# Patient Record
Sex: Female | Born: 1990 | Race: Black or African American | Hispanic: No | Marital: Single | Smoking: Never smoker
Health system: Southern US, Community
[De-identification: ages and names within clinical notes are randomized; demographics above are authoritative.]

## PROBLEM LIST (undated history)

## (undated) DIAGNOSIS — Z789 Other specified health status: Secondary | ICD-10-CM

---

## 2015-07-09 LAB — HM PAP SMEAR: HM Pap smear: NEGATIVE

## 2017-12-12 ENCOUNTER — Encounter (HOSPITAL_COMMUNITY): Payer: Self-pay | Admitting: Emergency Medicine

## 2017-12-12 ENCOUNTER — Ambulatory Visit (INDEPENDENT_AMBULATORY_CARE_PROVIDER_SITE_OTHER): Payer: Self-pay

## 2017-12-12 ENCOUNTER — Ambulatory Visit (HOSPITAL_COMMUNITY)
Admission: EM | Admit: 2017-12-12 | Discharge: 2017-12-12 | Disposition: A | Discharge status: Home / Self Care | Payer: Self-pay | Attending: Internal Medicine | Admitting: Internal Medicine

## 2017-12-12 DIAGNOSIS — B9789 Other viral agents as the cause of diseases classified elsewhere: Secondary | ICD-10-CM

## 2017-12-12 DIAGNOSIS — J069 Acute upper respiratory infection, unspecified: Secondary | ICD-10-CM

## 2017-12-12 DIAGNOSIS — R062 Wheezing: Secondary | ICD-10-CM

## 2017-12-12 MED ORDER — CETIRIZINE HCL 10 MG PO TABS: 10.0000 mg | ORAL_TABLET | Freq: Every day | ORAL | 0 refills | Status: DC

## 2017-12-12 MED ORDER — ALBUTEROL SULFATE HFA 108 (90 BASE) MCG/ACT IN AERS: 1.0000 | INHALATION_SPRAY | Freq: Four times a day (QID) | RESPIRATORY_TRACT | 0 refills | Status: AC | PRN

## 2017-12-12 NOTE — Discharge Instructions (Signed)
Please start daily zyrtec. Albuterol 1-2 puffs every 6 hours as needed for wheezing or shortness of breath. Please call to establish with a primary care provider as may need further evaluation and treatment if symptoms persist. If increased shortness of breath, chest pain, dizziness, lightheadedness or otherwise worsening of symptoms please return or go to the Er.

## 2017-12-12 NOTE — ED Provider Notes (Signed)
[facility]    CSN: [id number] Arrival date & time: 12/12/17  1441     History   Chief Complaint Chief Complaint  Patient presents with  . Shortness of Breath    HPI Sheila Sharp is a 27 y.o. female.   [name] presents with complaints of intermittent episodes of wheezing and shortness of breath. Has worsened over the past few days. Has been intermittent over the past month. Over the past few days she has also developed cold symptoms with cough and congestion as well. She states yesterday and today she has used her brother's asthma inhaler which helped significantly. Denies chest pain or palpitations. Denies nausea, vomiting, sweating. No recent travel or leg pain. She is not on any birth control. Does not smoke. No known contributing medical history.    ROS per HPI.       History reviewed. No pertinent past medical history.  There are no active problems to display for this patient.   History reviewed. No pertinent surgical history.  OB History    No data available       Home Medications    Prior to Admission medications   Medication Sig Start Date End Date Taking? Authorizing Provider  albuterol (PROVENTIL HFA;VENTOLIN HFA) 108 (90 Base) MCG/ACT inhaler Inhale 1-2 puffs into the lungs every 6 (six) hours as needed for wheezing or shortness of breath. 12/12/17   Georgetta Haber, NP  cetirizine (ZYRTEC) 10 MG tablet Take 1 tablet (10 mg total) by mouth daily. 12/12/17   Georgetta Haber, NP    Family History No family history on file.  Social History Social History   Tobacco Use  . Smoking status: Never Smoker  Substance Use Topics  . Alcohol use: Yes  . Drug use: Not on file     Allergies   Patient has no known allergies.   Review of Systems Review of Systems   Physical Exam Triage Vital Signs ED Triage Vitals [12/12/17 1528]  Enc Vitals Group     BP (!) 149/92     Pulse Rate 78     Resp 16     Temp 98.6 F (37 C)   Temp src      SpO2 100 %     Weight      Height      Head Circumference      Peak Flow      Pain Score      Pain Loc      Pain Edu?      Excl. in GC?    No data found.  Updated Vital Signs BP (!) 149/92   Pulse 78   Temp 98.6 F (37 C)   Resp 16   LMP 12/06/2017   SpO2 100%   Visual Acuity Right Eye Distance:   Left Eye Distance:   Bilateral Distance:    Right Eye Near:   Left Eye Near:    Bilateral Near:     Physical Exam  Constitutional: She is oriented to person, place, and time. She appears well-developed and well-nourished. No distress.  HENT:  Head: Normocephalic and atraumatic.  Right Ear: Tympanic membrane, external ear and ear canal normal.  Left Ear: Tympanic membrane, external ear and ear canal normal.  Nose: Nose normal.  Mouth/Throat: Uvula is midline, oropharynx is clear and moist and mucous membranes are normal. No tonsillar exudate.  Eyes: Conjunctivae and EOM are normal. Pupils are equal, round, and reactive to light.  Cardiovascular: Normal  rate, regular rhythm and normal heart sounds.  Pulmonary/Chest: Effort normal and breath sounds normal. She has no wheezes.  Mild upper airway wheezing noted on exam but lungs clear on auscultation; without tachypnea noted; strong dry cough noted   Neurological: She is alert and oriented to person, place, and time.  Skin: Skin is warm and dry.     UC Treatments / Results  Labs (all labs ordered are listed, but only abnormal results are displayed) Labs Reviewed - No data to display  EKG  EKG Interpretation None       Radiology Dg Chest 2 View  Result Date: 12/12/2017 CLINICAL DATA:  27 year old female with shortness of breath coughing and wheezing. EXAM: CHEST - 2 VIEW COMPARISON:  None. FINDINGS: Lung volumes are within normal limits. Mediastinal contours are normal. Visualized tracheal air column is within normal limits. No pneumothorax, pulmonary edema, pleural effusion or confluent pulmonary  opacity. No osseous abnormality identified. Negative visible bowel gas pattern. IMPRESSION: Negative.  No acute cardiopulmonary abnormality. Electronically Signed   By: Odessa FlemingH  Hall M.D.   On: 12/12/2017 16:31    Procedures Procedures (including critical care time)  Medications Ordered in UC Medications - No data to display   Initial Impression / Assessment and Plan / UC Course  I have reviewed the triage vital signs and the nursing notes.  Pertinent labs & imaging results that were available during my care of the patient were reviewed by me and considered in my medical decision making (see chart for details).     Lungs clear at this time although mild upper airway wheezing noted during exam. Chest xray negative for acute findings at this time. Zyrtec initiated at this time. Albuterol inhaler as needed for wheezing and/or shortness of breath. Encouraged establish with a primary care provider for further reevaluation in the next 1-2 weeks. Patient verbalized understanding and agreeable to plan.  Return precautions provided.   Final Clinical Impressions(s) / UC Diagnoses   Final diagnoses:  Viral URI with cough  Wheezing    ED Discharge Orders        Ordered    albuterol (PROVENTIL HFA;VENTOLIN HFA) 108 (90 Base) MCG/ACT inhaler  Every 6 hours PRN     12/12/17 1618    cetirizine (ZYRTEC) 10 MG tablet  Daily     12/12/17 1618       Controlled Substance Prescriptions [place] Controlled Substance Registry consulted? Not Applicable   Georgetta HaberBurky, Yachet Mattson B, NP 12/12/17 [phone]

## 2017-12-12 NOTE — ED Triage Notes (Addendum)
Pt states "im having problems breathing, its been happening for almost a month, it feels like im breathing through a straw." Pt resp e/u, speaking in full sentences. States shes had several episodes of shortness of breath. Pt states "I used my brothers inhaler and it helped a LOT".

## 2017-12-13 LAB — POCT PREGNANCY, URINE: PREG TEST UR: NEGATIVE

## 2018-05-04 LAB — HM HIV SCREENING LAB: HM HIV Screening: NEGATIVE

## 2018-09-27 NOTE — L&D Delivery Note (Signed)
Delivery Note At 11:06 AM a viable female was delivered via Vaginal, Spontaneous (Presentation: Left Occiput Anterior).  APGAR: 8, 9; weight pending.   Placenta status: Spontaneous, Intact.  Cord: 3 vessels with the following complications: None.  Cord pH: N/A  Anesthesia: Epidural Episiotomy: None Lacerations: None Suture Repair: N/A Est. Blood Loss (mL):  336mL  Mom to postpartum.  Baby to Couplet care / Skin to Skin.  Malachy Mood 09/08/2019, 11:27 AM

## 2018-10-13 IMAGING — DX DG CHEST 2V
2 series · 2 of 2 positions shown · non-contrast
Comparison: None.

CLINICAL DATA: 26-year-old female with shortness of breath coughing
and wheezing.

EXAM:
CHEST - 2 VIEW

[chest pa]
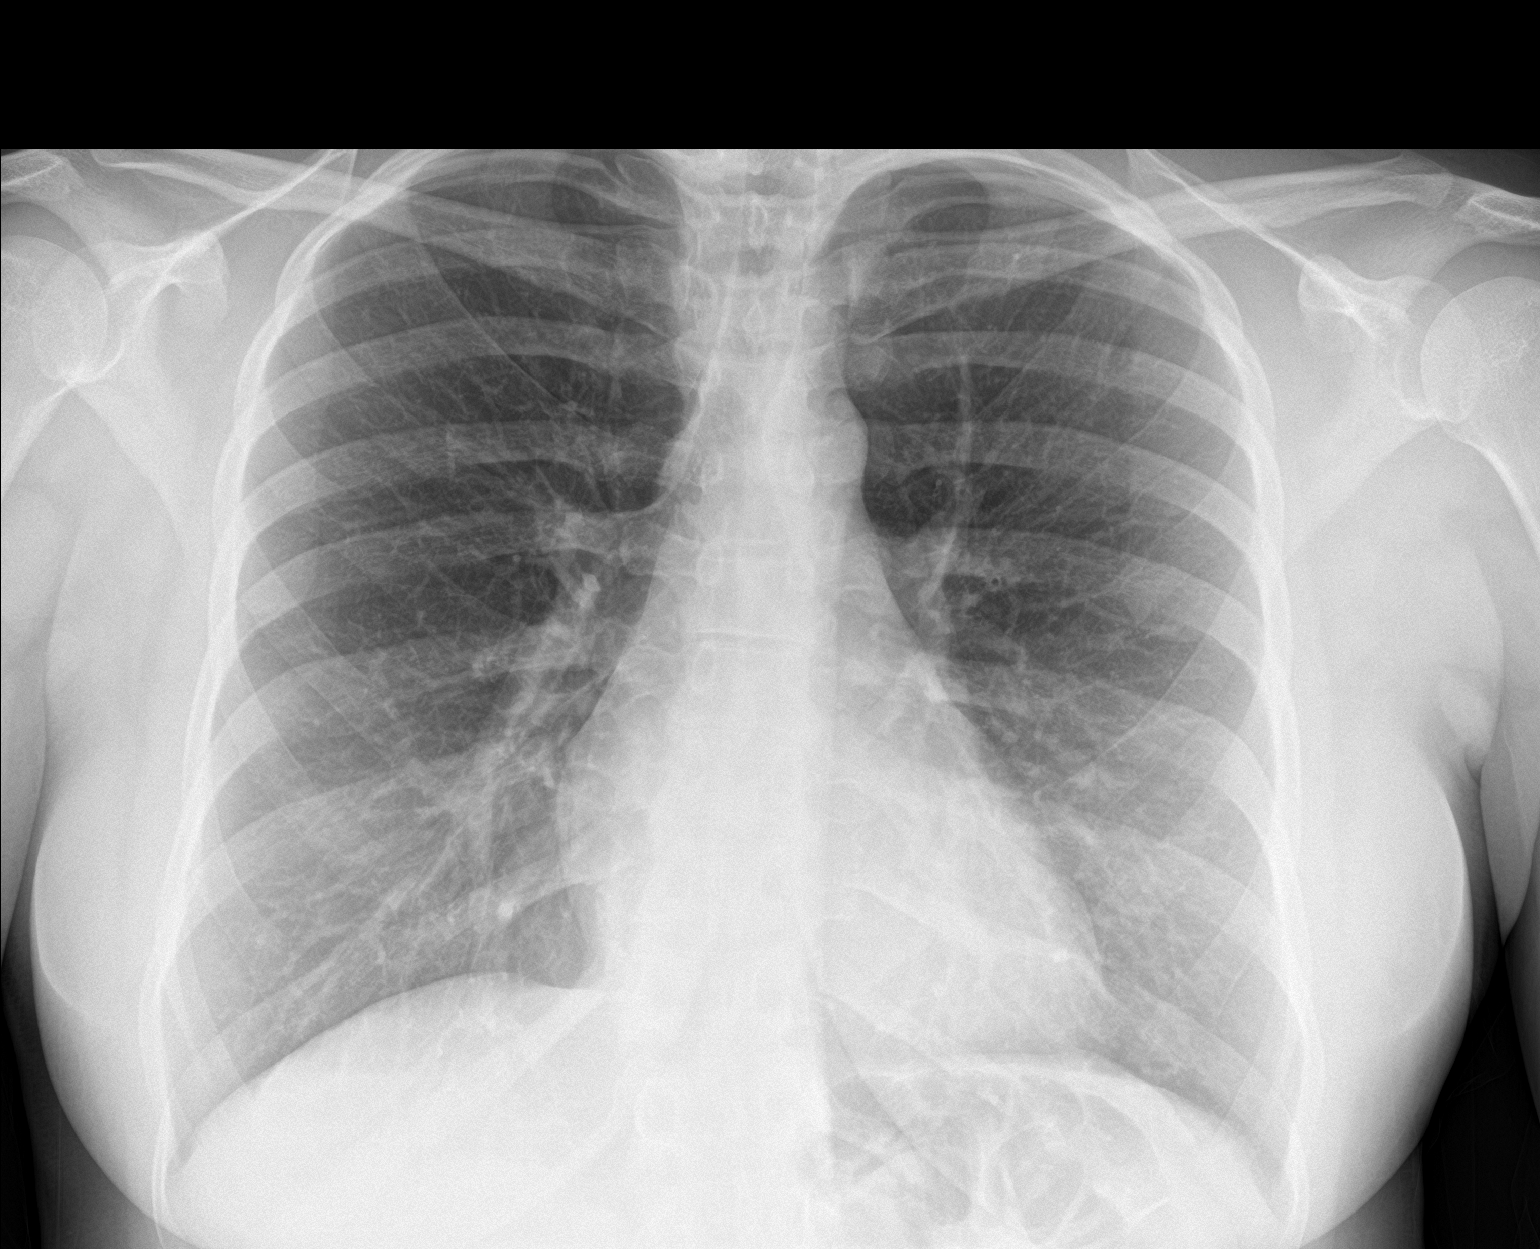

[chest lat]
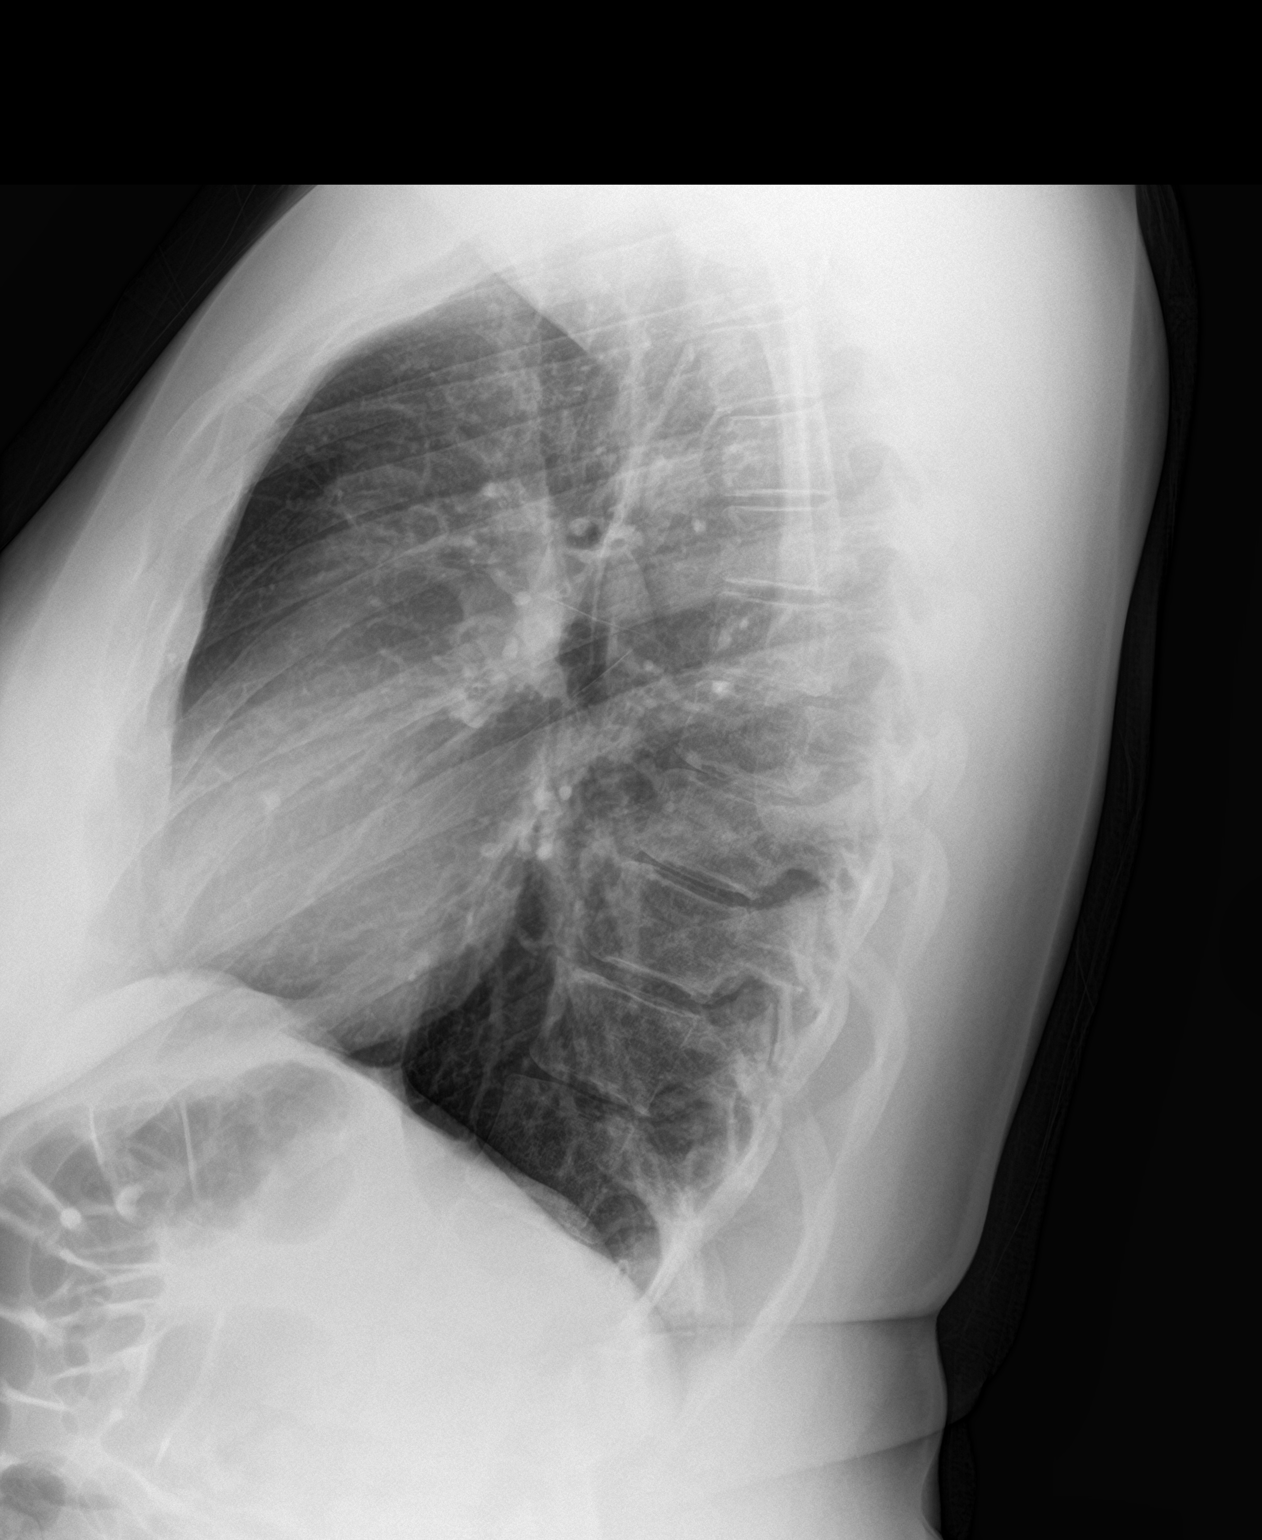

[2 of 2 positions shown; findings below may reference images not displayed]

FINDINGS: Lung volumes are within normal limits. Mediastinal contours are
normal. Visualized tracheal air column is within normal limits. No
pneumothorax, pulmonary edema, pleural effusion or confluent
pulmonary opacity. No osseous abnormality identified. Negative
visible bowel gas pattern.
IMPRESSION: Negative.  No acute cardiopulmonary abnormality.

## 2019-07-09 ENCOUNTER — Ambulatory Visit: Payer: Self-pay

## 2019-07-13 ENCOUNTER — Ambulatory Visit: Payer: Self-pay

## 2019-09-08 ENCOUNTER — Inpatient Hospital Stay
Admission: EM | Admit: 2019-09-08 | Discharge: 2019-09-09 | DRG: 807 | Disposition: A | Discharge status: Home / Self Care | Payer: Medicaid Other | Attending: Obstetrics & Gynecology | Admitting: Obstetrics & Gynecology

## 2019-09-08 ENCOUNTER — Other Ambulatory Visit: Payer: Self-pay

## 2019-09-08 ENCOUNTER — Inpatient Hospital Stay: Payer: Medicaid Other | Admitting: Anesthesiology

## 2019-09-08 DIAGNOSIS — Z3A38 38 weeks gestation of pregnancy: Secondary | ICD-10-CM

## 2019-09-08 DIAGNOSIS — O26893 Other specified pregnancy related conditions, third trimester: Secondary | ICD-10-CM | POA: Diagnosis present

## 2019-09-08 LAB — CBC
HCT: 37 % (ref 36.0–46.0)
Hemoglobin: 12.1 g/dL (ref 12.0–15.0)
MCH: 21 pg — ABNORMAL LOW (ref 26.0–34.0)
MCHC: 32.7 g/dL (ref 30.0–36.0)
MCV: 64.3 fL — ABNORMAL LOW (ref 80.0–100.0)
Platelets: 294 10*3/uL (ref 150–400)
RBC: 5.75 MIL/uL — ABNORMAL HIGH (ref 3.87–5.11)
RDW: 14.9 % (ref 11.5–15.5)
WBC: 14.1 10*3/uL — ABNORMAL HIGH (ref 4.0–10.5)
nRBC: 0 % (ref 0.0–0.2)

## 2019-09-08 LAB — HEPATITIS B SURFACE ANTIGEN: Hepatitis B Surface Ag: NONREACTIVE

## 2019-09-08 LAB — TYPE AND SCREEN
ABO/RH(D): B POS
Antibody Screen: NEGATIVE

## 2019-09-08 LAB — HIV ANTIBODY (ROUTINE TESTING W REFLEX): HIV Screen 4th Generation wRfx: NONREACTIVE

## 2019-09-08 LAB — GROUP B STREP BY PCR: Group B strep by PCR: POSITIVE — AB

## 2019-09-08 LAB — RPR: RPR Ser Ql: NONREACTIVE

## 2019-09-08 MED ORDER — PHENYLEPHRINE 40 MCG/ML (10ML) SYRINGE FOR IV PUSH (FOR BLOOD PRESSURE SUPPORT)
80.0000 ug | PREFILLED_SYRINGE | INTRAVENOUS | Status: DC | PRN
  Filled 2019-09-08: qty 10

## 2019-09-08 MED ORDER — LACTATED RINGERS IV SOLN: 500.0000 mL | Freq: Once | INTRAVENOUS | Status: DC

## 2019-09-08 MED ORDER — ONDANSETRON HCL 4 MG PO TABS: 4.0000 mg | ORAL_TABLET | ORAL | Status: DC | PRN

## 2019-09-08 MED ORDER — DIBUCAINE (PERIANAL) 1 % EX OINT: 1.0000 "application " | TOPICAL_OINTMENT | CUTANEOUS | Status: DC | PRN

## 2019-09-08 MED ORDER — WITCH HAZEL-GLYCERIN EX PADS: 1.0000 "application " | MEDICATED_PAD | CUTANEOUS | Status: DC | PRN

## 2019-09-08 MED ORDER — EPHEDRINE 5 MG/ML INJ
10.0000 mg | INTRAVENOUS | Status: DC | PRN
  Filled 2019-09-08: qty 2

## 2019-09-08 MED ORDER — SODIUM CHLORIDE 0.9 % IV SOLN
1.0000 g | INTRAVENOUS | Status: DC
  Administered 2019-09-08: 1 g via INTRAVENOUS
  Filled 2019-09-08 (×4): qty 1000

## 2019-09-08 MED ORDER — PRENATAL MULTIVITAMIN CH
1.0000 | ORAL_TABLET | Freq: Every day | ORAL | Status: DC
  Administered 2019-09-09: 1 via ORAL
  Filled 2019-09-08: qty 1

## 2019-09-08 MED ORDER — OXYTOCIN 40 UNITS IN NORMAL SALINE INFUSION - SIMPLE MED
2.5000 [IU]/h | INTRAVENOUS | Status: DC
  Administered 2019-09-08: 2.5 [IU]/h via INTRAVENOUS
  Filled 2019-09-08 (×2): qty 1000

## 2019-09-08 MED ORDER — SODIUM CHLORIDE 0.9 % IV SOLN
2.0000 g | Freq: Once | INTRAVENOUS | Status: AC
  Administered 2019-09-08: 2 g via INTRAVENOUS
  Filled 2019-09-08: qty 2000

## 2019-09-08 MED ORDER — IBUPROFEN 600 MG PO TABS
600.0000 mg | ORAL_TABLET | Freq: Four times a day (QID) | ORAL | Status: DC
  Administered 2019-09-08 – 2019-09-09 (×4): 600 mg via ORAL
  Filled 2019-09-08 (×4): qty 1

## 2019-09-08 MED ORDER — MISOPROSTOL 200 MCG PO TABS
ORAL_TABLET | ORAL | Status: AC
  Filled 2019-09-08: qty 4

## 2019-09-08 MED ORDER — ACETAMINOPHEN 325 MG PO TABS: 650.0000 mg | ORAL_TABLET | ORAL | Status: DC | PRN

## 2019-09-08 MED ORDER — BENZOCAINE-MENTHOL 20-0.5 % EX AERO: 1.0000 "application " | INHALATION_SPRAY | CUTANEOUS | Status: DC | PRN

## 2019-09-08 MED ORDER — LIDOCAINE-EPINEPHRINE (PF) 1.5 %-1:200000 IJ SOLN
INTRAMUSCULAR | Status: DC | PRN
  Administered 2019-09-08: 3 mL via PERINEURAL

## 2019-09-08 MED ORDER — LIDOCAINE HCL (PF) 1 % IJ SOLN
30.0000 mL | INTRAMUSCULAR | Status: AC | PRN
  Administered 2019-09-08: 3 mL via SUBCUTANEOUS

## 2019-09-08 MED ORDER — ONDANSETRON HCL 4 MG/2ML IJ SOLN: 4.0000 mg | INTRAMUSCULAR | Status: DC | PRN

## 2019-09-08 MED ORDER — SIMETHICONE 80 MG PO CHEW: 80.0000 mg | CHEWABLE_TABLET | ORAL | Status: DC | PRN

## 2019-09-08 MED ORDER — OXYCODONE-ACETAMINOPHEN 5-325 MG PO TABS: 2.0000 | ORAL_TABLET | ORAL | Status: DC | PRN

## 2019-09-08 MED ORDER — BUPIVACAINE HCL (PF) 0.25 % IJ SOLN: INTRAMUSCULAR | Status: DC | PRN

## 2019-09-08 MED ORDER — LACTATED RINGERS IV SOLN
INTRAVENOUS | Status: DC
  Administered 2019-09-08: 05:00:00 via INTRAVENOUS

## 2019-09-08 MED ORDER — BUTORPHANOL TARTRATE 1 MG/ML IJ SOLN
INTRAMUSCULAR | Status: AC
  Administered 2019-09-08: 1 mg via INTRAVENOUS
  Filled 2019-09-08: qty 1

## 2019-09-08 MED ORDER — SODIUM CHLORIDE 0.9 % IV SOLN: INTRAVENOUS | Status: DC | PRN

## 2019-09-08 MED ORDER — FENTANYL 2.5 MCG/ML W/ROPIVACAINE 0.15% IN NS 100 ML EPIDURAL (ARMC)
12.0000 mL/h | EPIDURAL | Status: DC
  Administered 2019-09-08: 07:00:00 12 mL/h via EPIDURAL

## 2019-09-08 MED ORDER — ONDANSETRON HCL 4 MG/2ML IJ SOLN
4.0000 mg | Freq: Four times a day (QID) | INTRAMUSCULAR | Status: DC | PRN
  Administered 2019-09-08: 4 mg via INTRAVENOUS
  Filled 2019-09-08: qty 2

## 2019-09-08 MED ORDER — SENNOSIDES-DOCUSATE SODIUM 8.6-50 MG PO TABS
2.0000 | ORAL_TABLET | ORAL | Status: DC
  Administered 2019-09-09: 2 via ORAL

## 2019-09-08 MED ORDER — OXYTOCIN BOLUS FROM INFUSION
500.0000 mL | Freq: Once | INTRAVENOUS | Status: AC
  Administered 2019-09-08: 500 mL via INTRAVENOUS

## 2019-09-08 MED ORDER — OXYTOCIN 10 UNIT/ML IJ SOLN
INTRAMUSCULAR | Status: AC
  Filled 2019-09-08: qty 2

## 2019-09-08 MED ORDER — BUTORPHANOL TARTRATE 1 MG/ML IJ SOLN: 1.0000 mg | INTRAMUSCULAR | Status: DC | PRN

## 2019-09-08 MED ORDER — LACTATED RINGERS IV SOLN: 500.0000 mL | INTRAVENOUS | Status: DC | PRN

## 2019-09-08 MED ORDER — FENTANYL 2.5 MCG/ML W/ROPIVACAINE 0.15% IN NS 100 ML EPIDURAL (ARMC)
EPIDURAL | Status: AC
  Filled 2019-09-08: qty 100

## 2019-09-08 MED ORDER — COCONUT OIL OIL: 1.0000 "application " | TOPICAL_OIL | Status: DC | PRN

## 2019-09-08 MED ORDER — ACETAMINOPHEN 325 MG PO TABS
650.0000 mg | ORAL_TABLET | ORAL | Status: DC | PRN
  Administered 2019-09-08: 650 mg via ORAL
  Filled 2019-09-08: qty 2

## 2019-09-08 MED ORDER — DIPHENHYDRAMINE HCL 50 MG/ML IJ SOLN: 12.5000 mg | INTRAMUSCULAR | Status: DC | PRN

## 2019-09-08 MED ORDER — OXYCODONE-ACETAMINOPHEN 5-325 MG PO TABS: 1.0000 | ORAL_TABLET | ORAL | Status: DC | PRN

## 2019-09-08 MED ORDER — LIDOCAINE HCL (PF) 1 % IJ SOLN
INTRAMUSCULAR | Status: AC
  Filled 2019-09-08: qty 30

## 2019-09-08 MED ORDER — DIPHENHYDRAMINE HCL 25 MG PO CAPS: 25.0000 mg | ORAL_CAPSULE | Freq: Four times a day (QID) | ORAL | Status: DC | PRN

## 2019-09-08 MED ORDER — AMMONIA AROMATIC IN INHA
RESPIRATORY_TRACT | Status: AC
  Filled 2019-09-08: qty 10

## 2019-09-08 MED ORDER — BUPIVACAINE HCL (PF) 0.25 % IJ SOLN
INTRAMUSCULAR | Status: DC | PRN
  Administered 2019-09-08: 10 mg via EPIDURAL

## 2019-09-08 NOTE — Progress Notes (Signed)
  Labor Progress Note   28 y.o. G1P0 @ [redacted]w[redacted]d , admitted for  Pregnancy, Labor Management.   Subjective:  Pain better w epidural  Objective:  BP 138/79   Pulse 79   Temp 98.4 F (36.9 C)   Resp 20   Ht 6' (1.829 m)   Wt 115.2 kg   SpO2 100%   BMI 34.45 kg/m  Abd: gravid, ND, FHT present, mild tenderness on exam Extr: trace to 1+ bilateral pedal edema SVE: CERVIX: 6 cm dilated  EFM: FHR: 130 bpm, variability: moderate,  accelerations:  Present,  decelerations:  Absent Toco: Frequency: Every 3-5 minutes Labs: I have reviewed the patient's lab results.   Assessment & Plan:  G1P0 @ [redacted]w[redacted]d, admitted for  Pregnancy and Labor/Delivery Management  1. Pain management: epidural. 2. FWB: FHT category 1.  3. ID: GBS not done 4. Labor management: Cont exp mgt  All discussed with patient, see orders  Barnett Applebaum, MD, [facility], [facility] 09/08/2019  7:50 AM

## 2019-09-08 NOTE — Anesthesia Procedure Notes (Signed)
Epidural Patient location during procedure: OB Start time: 09/08/2019 6:05 AM End time: 09/08/2019 6:19 AM  Staffing Anesthesiologist: Alvin Critchley, MD Performed: anesthesiologist   Preanesthetic Checklist Completed: patient identified, IV checked, site marked, risks and benefits discussed, surgical consent, monitors and equipment checked, pre-op evaluation and timeout performed  Epidural Patient position: sitting Prep: Betadine Patient monitoring: heart rate, continuous pulse ox and blood pressure Approach: midline Location: L3-L4 Injection technique: LOR air  Needle:  Needle type: Tuohy  Needle gauge: 17 G Needle length: 9 cm and 9 Catheter type: closed end flexible Catheter size: 19 Gauge Test dose: negative and 1.5% lidocaine with Epi 1:200 K  Assessment Sensory level: T8 Events: blood not aspirated, injection not painful, no injection resistance, no paresthesia and negative IV test  Additional Notes Time out called.  Patient placed in sitting position.  Back prepped and draped in sterile fashion.  A skin wheal was made in the L3-L4 interspace with 1% Lidocaine plain.  A 17G Tuohy needle was advanced into the epidural space by a loss of resistance technique. The epidural catheter was advanced 3 cm and the TD was negative.  No blood, fluid or paresthesias.  The patient tolerated the procedure well and the cathter was affixed to the back in sterile fashion.Reason for block:procedure for pain

## 2019-09-08 NOTE — H&P (Signed)
Obstetrics Admission History & Physical   Contractions   HPI:  28 y.o. G1P0 @ [redacted]w[redacted]d (09/20/2019, by Patient Reported). Admitted on 09/08/2019:     Presents for painful contractions that began last night and have been worsening, currently every 2 minutes and rated 10/10. Denies VB or ROM.  Was seen at different hospital and was ruled out for labor and thus sent home.  She came to [facility] as this is where her mother lives.  No other problems reported this pregnancy; denies HTN, DM, PTL, other.  Prenatal care at: at another place. Pregnancy complicated by none.  ROS: A review of systems was performed and negative, except as stated in the above HPI. PMHx: History reviewed. No pertinent past medical history. PSHx: History reviewed. No pertinent surgical history. Medications:  Medications Prior to Admission  Medication Sig Dispense Refill Last Dose  . albuterol (PROVENTIL HFA;VENTOLIN HFA) 108 (90 Base) MCG/ACT inhaler Inhale 1-2 puffs into the lungs every 6 (six) hours as needed for wheezing or shortness of breath. 1 Inhaler 0 09/07/2019  . cetirizine (ZYRTEC) 10 MG tablet Take 1 tablet (10 mg total) by mouth daily. 30 tablet 0 09/07/2019  . Prenatal Vit-Fe Fumarate-FA (MULTIVITAMIN-PRENATAL) 27-0.8 MG TABS tablet Take 1 tablet by mouth daily at 12 noon.   09/07/2019   Allergies: has No Known Allergies. OBHx:  OB History  Gravida Para Term Preterm AB Living  1            SAB TAB Ectopic Multiple Live Births               # Outcome Date GA Lbr Len/2nd Weight Sex Delivery Anes PTL Lv  1 Current            TOI:ZTIWPYKD/[id number] except as detailed in HPI.Marland Kitchen  No family history of birth defects. Soc Hx: Alcohol: none and Recreational drug use: none  Objective:   Vitals:   09/08/19 0217  BP: 138/79  Pulse: 79  Resp: 20  Temp: 98.4 F (36.9 C)  SpO2: 100%   Constitutional: Well nourished, well developed female in no acute distress.  HEENT: normal Skin: Warm and dry.   Cardiovascular:Regular rate and rhythm.   Extremity: trace to 1+ bilateral pedal edema Respiratory: Clear to auscultation bilateral. Normal respiratory effort Abdomen: gravid, ND, FHT present, marked tenderness on exam Back: no CVAT Neuro: DTRs 2+, Cranial nerves grossly intact Psych: Alert and Oriented x3. No memory deficits. Normal mood and affect.  MS: normal gait, normal bilateral lower extremity ROM/strength/stability.  Pelvic exam: is not limited by body habitus EGBUS: within normal limits Vagina: within normal limits and with normal mucosa Cervix: 1/80/-3 Uterus: Spontaneous uterine activity  Adnexa: not evaluated  EFM:FHR: 120 bpm, variability: minimal ,  accelerations:  Present,  decelerations:  Absent Toco: Frequency: Every 2-3 minutes   Perinatal info:  Unknown, will obtain records as available  Assessment & Plan:   28 y.o. G1P0 @ [redacted]w[redacted]d, Admitted on 09/08/2019:in early labor with a lot of pain No bleeding or signs of abruption No reported or suspected drug use    Admit for labor, Observe for cervical change and Fetal Wellbeing Reassuring  Barnett Applebaum, MD, [facility], [facility] 09/08/2019  4:25 AM

## 2019-09-08 NOTE — OB Triage Note (Signed)
Pt is a 28y/o G1P0 at [redacted]w[redacted]d with c/o contractions. Pt states +FM. Pt denies LOF, and VB. Pt states she was seen earlier today from 8-10PM for vaginal bleeding that stopped around 6PM and was discharged. Pt states she has not had any more vaginal bleeding since. Pt rates ctx 10/10 on pain sca Monitors applied and assessing. Initial FHT 125.

## 2019-09-08 NOTE — Discharge Summary (Signed)
Obstetric Discharge Summary Reason for Admission: onset of labor Prenatal Procedures: none Intrapartum Procedures: spontaneous vaginal delivery Postpartum Procedures: none Complications-Operative and Postpartum: none Hemoglobin  Date Value Ref Range Status  09/09/2019 10.8 (L) 12.0 - 15.0 g/dL Final   HCT  Date Value Ref Range Status  09/09/2019 33.6 (L) 36.0 - 46.0 % Final    Physical Exam:  General: alert, appears stated age and no distress Lochia: appropriate Uterine Fundus: firm Incision: n/a DVT Evaluation: No evidence of DVT seen on physical exam.  Discharge Diagnoses: Term Pregnancy-delivered  Discharge Information: Date: 09/09/2019 Activity: pelvic rest Diet: routine Allergies as of 09/09/2019   No Known Allergies     Medication List    TAKE these medications   albuterol 108 (90 Base) MCG/ACT inhaler Commonly known as: VENTOLIN HFA Inhale 1-2 puffs into the lungs every 6 (six) hours as needed for wheezing or shortness of breath.   cetirizine 10 MG tablet Commonly known as: ZYRTEC Take 1 tablet (10 mg total) by mouth daily.   ibuprofen 600 MG tablet Commonly known as: ADVIL Take 1 tablet (600 mg total) by mouth every 6 (six) hours.   multivitamin-prenatal 27-0.8 MG Tabs tablet Take 1 tablet by mouth daily at 12 noon.   norgestimate-ethinyl estradiol 0.25-35 MG-MCG tablet Commonly known as: ORTHO-CYCLEN Take 1 tablet by mouth daily. Start 3 week after you delivery       Condition: stable Discharge to: home Follow-up Information    Malachy Mood, MD Follow up in 6 week(s).   Specialty: Obstetrics and Gynecology Why: 6 week postpartum, alternatively you may follow up with your established OB provider Contact information: [place] [address] [phone]           Newborn Data: Live born child  Birth Weight:   APGAR: 67, 9  Newborn Delivery   Birth date/time: 09/08/2019 11:06:00 Delivery type: Vaginal,  Spontaneous      Home with mother.  Malachy Mood 09/09/2019, 9:52 AM

## 2019-09-08 NOTE — Anesthesia Preprocedure Evaluation (Addendum)
Anesthesia Evaluation  Patient identified by MRN, date of birth, ID band Patient awake    Reviewed: Allergy & Precautions, NPO status , Patient's Chart, lab work & pertinent test results  Airway Mallampati: II       Dental   Pulmonary neg pulmonary ROS,    Pulmonary exam normal        Cardiovascular negative cardio ROS Normal cardiovascular exam     Neuro/Psych negative neurological ROS  negative psych ROS   GI/Hepatic negative GI ROS, Neg liver ROS,   Endo/Other  negative endocrine ROS  Renal/GU negative Renal ROS  negative genitourinary   Musculoskeletal negative musculoskeletal ROS (+)   Abdominal Normal abdominal exam  (+)   Peds negative pediatric ROS (+)  Hematology negative hematology ROS (+)   Anesthesia Other Findings   Reproductive/Obstetrics (+) Pregnancy                             Anesthesia Physical Anesthesia Plan  ASA: II  Anesthesia Plan: Epidural   Post-op Pain Management:    Induction: Intravenous  PONV Risk Score and Plan:   Airway Management Planned: Nasal Cannula  Additional Equipment:   Intra-op Plan:   Post-operative Plan:   Informed Consent: I have reviewed the patients History and Physical, chart, labs and discussed the procedure including the risks, benefits and alternatives for the proposed anesthesia with the patient or authorized representative who has indicated his/her understanding and acceptance.     Dental advisory given  Plan Discussed with: CRNA and Surgeon  Anesthesia Plan Comments:         Anesthesia Quick Evaluation

## 2019-09-09 LAB — CBC
HCT: 33.6 % — ABNORMAL LOW (ref 36.0–46.0)
Hemoglobin: 10.8 g/dL — ABNORMAL LOW (ref 12.0–15.0)
MCH: 21.3 pg — ABNORMAL LOW (ref 26.0–34.0)
MCHC: 32.1 g/dL (ref 30.0–36.0)
MCV: 66.3 fL — ABNORMAL LOW (ref 80.0–100.0)
Platelets: 279 10*3/uL (ref 150–400)
RBC: 5.07 MIL/uL (ref 3.87–5.11)
RDW: 14.9 % (ref 11.5–15.5)
WBC: 12.6 10*3/uL — ABNORMAL HIGH (ref 4.0–10.5)
nRBC: 0 % (ref 0.0–0.2)

## 2019-09-09 LAB — RUBELLA SCREEN: Rubella: 3.07 index (ref >0.99)

## 2019-09-09 MED ORDER — IBUPROFEN 600 MG PO TABS: 600.0000 mg | ORAL_TABLET | Freq: Four times a day (QID) | ORAL | 0 refills | Status: DC

## 2019-09-09 MED ORDER — NORGESTIMATE-ETH ESTRADIOL 0.25-35 MG-MCG PO TABS: 1.0000 | ORAL_TABLET | Freq: Every day | ORAL | 11 refills | Status: DC

## 2019-09-09 NOTE — Discharge Instructions (Signed)
Discharge Instructions:   Follow-up Appointment: Call ASAP and schedule a follow-up postpartum visit in 6 weeks!   If there are any new medications, they have been ordered and will be available for pickup at the listed pharmacy on your way home from the hospital.   Call office if you have any of the following: headache, visual changes, fever >101.0 F, chills, shortness of breath, breast concerns, excessive vaginal bleeding, incision drainage or problems, leg pain or redness, depression or any other concerns. If you have vaginal discharge with an odor, let your doctor know.   It is normal to bleed for up to 6 weeks. You should not soak through more than 1 pad in 1 hour. If you have a blood clot larger than your fist with continued bleeding, call your doctor.   Activity: Do not lift > 10 lbs for 6 weeks (do not lift anything heavier than your baby). No intercourse, tampons, swimming pools, hot tubs, baths (only showers) for 6 weeks.  No driving for 1-2 weeks. Continue prenatal vitamin, especially if breastfeeding. Increase calories and fluids (water) while breastfeeding.   Your milk will come in, in the next couple of days (right now it is colostrum). You may have a slight fever when your milk comes in, but it should go away on its own.  If it does not, and rises above 101 F please call the doctor. You will also feel achy and your breasts will be firm. They will also start to leak. If you are breastfeeding, continue as you have been and you can pump/express milk for comfort.   If you have too much milk, your breasts can become engorged, which could lead to mastitis. This is an infection of the milk ducts. It can be very painful and you will need to notify your doctor to obtain a prescription for antibiotics. You can also treat it with a shower or hot/cold compress.   For concerns about your baby, please call your pediatrician.  For breastfeeding concerns, the lactation consultant can be reached at  [phone].   Postpartum blues (feelings of happy one minute and sad another minute) are normal for the first few weeks but if it gets worse let your doctor know.   Congratulations! We enjoyed caring for you and your new bundle of joy!  

## 2019-09-09 NOTE — Anesthesia Postprocedure Evaluation (Signed)
Anesthesia Post Note  Patient: Sheila Sharp  Procedure(s) Performed: AN AD [place]  Patient location during evaluation: Mother Baby Anesthesia Type: Epidural Level of consciousness: awake and alert Pain management: pain level controlled Vital Signs Assessment: post-procedure vital signs reviewed and stable Respiratory status: spontaneous breathing, nonlabored ventilation and respiratory function stable Cardiovascular status: stable Postop Assessment: no headache, no backache and epidural receding (Mild soreness at insertion site, clean) Anesthetic complications: no     Last Vitals:  Vitals:   09/08/19 2319 09/09/19 0314  BP: 128/80 120/71  Pulse: 72 65  Resp:  20  Temp: 37 C 36.5 C  SpO2: 100% 100%    Last Pain:  Vitals:   09/09/19 0314  TempSrc: Oral  PainSc: 0-No pain                 Alphonsus Sias

## 2019-09-09 NOTE — Progress Notes (Signed)
Pt discharged with infant. Discharge instructions, prescriptions, and follow up appointments given to and reviewed with patient. Pt verbalized understanding. Escorted out by auxillary.  

## 2021-03-31 DIAGNOSIS — G971 Other reaction to spinal and lumbar puncture: Secondary | ICD-10-CM

## 2021-10-26 ENCOUNTER — Ambulatory Visit: Payer: Medicaid Other

## 2022-03-12 ENCOUNTER — Other Ambulatory Visit (HOSPITAL_COMMUNITY): Payer: Self-pay

## 2022-06-17 ENCOUNTER — Ambulatory Visit (LOCAL_COMMUNITY_HEALTH_CENTER): Payer: Medicaid Other | Admitting: Advanced Practice Midwife

## 2022-06-17 ENCOUNTER — Encounter: Payer: Self-pay | Admitting: Advanced Practice Midwife

## 2022-06-17 VITALS — BP 114/75 | Ht 72.0 in | Wt 258.6 lb

## 2022-06-17 DIAGNOSIS — Z01419 Encounter for gynecological examination (general) (routine) without abnormal findings: Secondary | ICD-10-CM | POA: Diagnosis not present

## 2022-06-17 DIAGNOSIS — Z3009 Encounter for other general counseling and advice on contraception: Secondary | ICD-10-CM

## 2022-06-17 DIAGNOSIS — N760 Acute vaginitis: Secondary | ICD-10-CM

## 2022-06-17 DIAGNOSIS — E669 Obesity, unspecified: Secondary | ICD-10-CM

## 2022-06-17 DIAGNOSIS — Z309 Encounter for contraceptive management, unspecified: Secondary | ICD-10-CM

## 2022-06-17 DIAGNOSIS — B9689 Other specified bacterial agents as the cause of diseases classified elsewhere: Secondary | ICD-10-CM

## 2022-06-17 LAB — WET PREP FOR TRICH, YEAST, CLUE
Trichomonas Exam: NEGATIVE
Yeast Exam: NEGATIVE

## 2022-06-17 MED ORDER — METRONIDAZOLE 500 MG PO TABS: 500.0000 mg | ORAL_TABLET | Freq: Two times a day (BID) | ORAL | 0 refills | Status: AC

## 2022-06-17 NOTE — Progress Notes (Signed)
[facility] Number: [phone]    Family Planning Visit- Initial Visit  Subjective:  Sheila Sharp is a 31 y.o. SBF nonsmoker  G2P1002 (2,1)  being seen today for an initial annual visit and to discuss reproductive life planning.  The patient is currently using No Method - Other Reason for pregnancy prevention. Patient reports   does not know want a pregnancy in the next year.     report they are looking for a method that provides Other no birth control  Patient has the following medical conditions has Obesity BMI=35 on their problem list.  Chief Complaint  Patient presents with   Contraception    Physical, pap, std screening     Patient reports here for physical, pap, STD screen. LMP 05/26/22. Last sex 06/07/22 without condom; with current partner x 2 mo; 2 partners in last 3 mo. May want a pregnancy. Last ETOH yesterday (5 shots liquor) q weekend. Last dental exam 2022. Not working. Living with her 2 kids and her mom. Last PE 11/18/16. Last pap 07/09/15 neg.  Patient denies cigs, vaping, cigars, MJ  Body mass index is 35.07 kg/m. - Patient is eligible for diabetes screening based on BMI and age >33?  not applicable AS5K ordered? not applicable  Patient reports 2  partner/s in last year. Desires STI screening?  No - declines bloodwork  Has patient been screened once for HCV in the past?  No  No results found for: "HCVAB"  Does the patient have current drug use (including MJ), have a partner with drug use, and/or has been incarcerated since last result? No  If yes-- Screen for HCV through [facility]   Does the patient meet criteria for HBV testing? No  Criteria:  -Household, sexual or needle sharing contact with HBV -History of drug use -HIV positive -Those with known Hep C   Health Maintenance Due  Topic Date Due   COVID-19 Vaccine (1) Never done   Hepatitis C Screening  Never done    TETANUS/TDAP  05/13/2018   PAP SMEAR-Modifier  07/08/2018   INFLUENZA VACCINE  Never done    Review of Systems  Constitutional:  Positive for weight loss (30 lb wt gain in past 3 mo; goes to gym 4x/wk x 1 hour).  All other systems reviewed and are negative.   The following portions of the patient's history were reviewed and updated as appropriate: allergies, current medications, past family history, past medical history, past social history, past surgical history and problem list. Problem list updated.   See flowsheet for other program required questions.  Objective:   Vitals:   06/17/22 1313  BP: 114/75  Weight: 258 lb 9.6 oz (117.3 kg)  Height: 6' (1.829 m)    Physical Exam Constitutional:      Appearance: Normal appearance. She is obese.  HENT:     Head: Normocephalic and atraumatic.     Mouth/Throat:     Mouth: Mucous membranes are moist.  Eyes:     Conjunctiva/sclera: Conjunctivae normal.  Neck:     Thyroid: No thyroid mass, thyromegaly or thyroid tenderness.  Cardiovascular:     Rate and Rhythm: Normal rate and regular rhythm.  Pulmonary:     Effort: Pulmonary effort is normal.     Breath sounds: Normal breath sounds.  Chest:  Breasts:    Right: Normal.     Left: Normal.  Abdominal:     Palpations: Abdomen  is soft.     Comments: Soft without masses or tenderness, fair toene  Genitourinary:    General: Normal vulva.     Exam position: Lithotomy position.     Vagina: Vaginal discharge (grey thin leukorrhea with malodor, >4.5) present.     Cervix: Normal.     Uterus: Normal.      Adnexa: Right adnexa normal and left adnexa normal.     Rectum: Normal.     Comments: Pap done Musculoskeletal:        General: Normal range of motion.     Cervical back: Normal range of motion and neck supple.  Skin:    General: Skin is warm and dry.  Neurological:     Mental Status: She is alert.  Psychiatric:        Mood and Affect: Mood normal.       Assessment and  Plan:  [name] is a 31 y.o. female presenting to the [facility] Department for an initial annual wellness/contraceptive visit  Contraception counseling: Reviewed options based on patient desire and reproductive life plan. Patient is interested in No Method - Other Reason. This was provided to the patient today.  if not why not clearly documented  Risks, benefits, and typical effectiveness rates were reviewed.  Questions were answered.  Written information was also given to the patient to review.    The patient will follow up in  prn  for surveillance.  The patient was told to call with any further questions, or with any concerns about this method of contraception.  Emphasized use of condoms 100% of the time for STI prevention.  Need for ECP was assessed. Patient reported > 120 hours .  Reviewed options and patient desired No method of ECP, declined all    1. Obesity, unspecified classification, unspecified obesity type, unspecified whether serious comorbidity present   2. Family planning Please give pt dental list Treat wet mount per standing orders Immunization nurse consult  - WET PREP FOR TRICH, YEAST, CLUE - Chlamydia/Gonorrhea [facility]  3. Well woman exam with routine gynecological exam Declines birth control     No follow-ups on file.  No future appointments.  Alberteen Spindle, CNM

## 2022-06-17 NOTE — Progress Notes (Signed)
Pt is here for PE, Pap smear and STD screening.  Wet mount results reviewed.  The patient was dispensed Metronidazole 500 mg #14  today. I provided counseling today regarding the medication. We discussed the medication, the side effects and when to call clinic. Patient given the opportunity to ask questions. Questions answered.  FP packet given to pt.

## 2022-06-23 LAB — IGP, APTIMA HPV
HPV Aptima: NEGATIVE
PAP Smear Comment: 0

## 2022-06-29 ENCOUNTER — Telehealth: Payer: Self-pay

## 2022-06-29 ENCOUNTER — Other Ambulatory Visit: Payer: Self-pay

## 2022-06-29 ENCOUNTER — Emergency Department
Admission: EM | Admit: 2022-06-29 | Discharge: 2022-06-29 | Disposition: A | Discharge status: Home / Self Care | Payer: Medicaid Other | Attending: Emergency Medicine | Admitting: Emergency Medicine

## 2022-06-29 DIAGNOSIS — N760 Acute vaginitis: Secondary | ICD-10-CM | POA: Diagnosis not present

## 2022-06-29 DIAGNOSIS — R1030 Lower abdominal pain, unspecified: Secondary | ICD-10-CM | POA: Diagnosis present

## 2022-06-29 DIAGNOSIS — B9689 Other specified bacterial agents as the cause of diseases classified elsewhere: Secondary | ICD-10-CM

## 2022-06-29 DIAGNOSIS — R109 Unspecified abdominal pain: Secondary | ICD-10-CM

## 2022-06-29 LAB — URINALYSIS, ROUTINE W REFLEX MICROSCOPIC
Bilirubin Urine: NEGATIVE
Glucose, UA: NEGATIVE mg/dL
Ketones, ur: 20 mg/dL — AB
Nitrite: NEGATIVE
Protein, ur: 100 mg/dL — AB
Specific Gravity, Urine: 1.026 (ref 1.005–1.030)
pH: 5 (ref 5.0–8.0)

## 2022-06-29 LAB — COMPREHENSIVE METABOLIC PANEL
ALT: 12 U/L (ref 0–44)
AST: 14 U/L — ABNORMAL LOW (ref 15–41)
Albumin: 3.9 g/dL (ref 3.5–5.0)
Alkaline Phosphatase: 51 U/L (ref 38–126)
Anion gap: 11 (ref 5–15)
BUN: 9 mg/dL (ref 6–20)
CO2: 24 mmol/L (ref 22–32)
Calcium: 8.9 mg/dL (ref 8.9–10.3)
Chloride: 100 mmol/L (ref 98–111)
Creatinine, Ser: 0.84 mg/dL (ref 0.44–1.00)
GFR, Estimated: >60 mL/min (ref >60)
Glucose, Bld: 114 mg/dL — ABNORMAL HIGH (ref 70–99)
Potassium: 3.2 mmol/L — ABNORMAL LOW (ref 3.5–5.1)
Sodium: 135 mmol/L (ref 135–145)
Total Bilirubin: 1.3 mg/dL — ABNORMAL HIGH (ref 0.3–1.2)
Total Protein: 8 g/dL (ref 6.5–8.1)

## 2022-06-29 LAB — WET PREP, GENITAL
Sperm: NONE SEEN
Trich, Wet Prep: NONE SEEN
WBC, Wet Prep HPF POC: >=10 — AB (ref <10)
Yeast Wet Prep HPF POC: NONE SEEN

## 2022-06-29 LAB — CBC
HCT: 39.9 % (ref 36.0–46.0)
Hemoglobin: 12.1 g/dL (ref 12.0–15.0)
MCH: 20.7 pg — ABNORMAL LOW (ref 26.0–34.0)
MCHC: 30.3 g/dL (ref 30.0–36.0)
MCV: 68.3 fL — ABNORMAL LOW (ref 80.0–100.0)
Platelets: 300 10*3/uL (ref 150–400)
RBC: 5.84 MIL/uL — ABNORMAL HIGH (ref 3.87–5.11)
RDW: 15.9 % — ABNORMAL HIGH (ref 11.5–15.5)
WBC: 12.1 10*3/uL — ABNORMAL HIGH (ref 4.0–10.5)
nRBC: 0 % (ref 0.0–0.2)

## 2022-06-29 LAB — LIPASE, BLOOD: Lipase: 34 U/L (ref 11–51)

## 2022-06-29 LAB — POC URINE PREG, ED: Preg Test, Ur: NEGATIVE

## 2022-06-29 LAB — CHLAMYDIA/NGC RT PCR (ARMC ONLY)
Chlamydia Tr: NOT DETECTED
N gonorrhoeae: DETECTED — AB

## 2022-06-29 MED ORDER — LIDOCAINE HCL (PF) 1 % IJ SOLN
1.0000 mL | Freq: Once | INTRAMUSCULAR | Status: AC
  Administered 2022-06-29: 1 mL

## 2022-06-29 MED ORDER — CEFTRIAXONE SODIUM 250 MG IJ SOLR
250.0000 mg | Freq: Once | INTRAMUSCULAR | Status: AC
  Administered 2022-06-29: 250 mg via INTRAMUSCULAR
  Filled 2022-06-29: qty 250

## 2022-06-29 MED ORDER — METRONIDAZOLE 500 MG PO TABS: 500.0000 mg | ORAL_TABLET | Freq: Two times a day (BID) | ORAL | 0 refills | Status: AC

## 2022-06-29 MED ORDER — AZITHROMYCIN 500 MG PO TABS
1000.0000 mg | ORAL_TABLET | Freq: Once | ORAL | Status: AC
  Administered 2022-06-29: 1000 mg via ORAL
  Filled 2022-06-29: qty 2

## 2022-06-29 NOTE — ED Triage Notes (Signed)
Pt comes with c/o 3 days belly pain the patient states lower belly pain. Pt states no N/V.D. pt states little pain when peeing and attempting to have BM.

## 2022-06-29 NOTE — ED Provider Notes (Signed)
[facility] Provider Note    Event Date/Time   First MD Initiated Contact with Patient 06/29/22 1139     (approximate)  History   Chief Complaint: Abdominal Pain  HPI  Sheila Sharp is a 31 y.o. female with no significant past medical history presents emergency department for lower abdominal discomfort.  According to the patient over the past 3 to 4 days she has been experiencing some lower abdominal discomfort.  Denies any nausea vomiting or diarrhea.  No dysuria.  States she feels somewhat constipated at times.  Denies any fever.  Patient states she was seen at the health department approximately 10 days ago had a pelvic exam performed but is not yet heard of any results.  Denies any vaginal discharge did recently start her menstrual cycle.  No fever.  Physical Exam   Triage Vital Signs: ED Triage Vitals  Enc Vitals Group     BP 06/29/22 1126 131/78     Pulse Rate 06/29/22 1126 (!) 111     Resp 06/29/22 1126 18     Temp 06/29/22 1126 98 F (36.7 C)     Temp src --      SpO2 06/29/22 1126 97 %     Weight 06/29/22 1232 258 lb 9.6 oz (117.3 kg)     Height 06/29/22 1232 6' (1.829 m)     Head Circumference --      Peak Flow --      Pain Score 06/29/22 1124 8     Pain Loc --      Pain Edu? --      Excl. in [place]? --     Most recent vital signs: Vitals:   06/29/22 1126  BP: 131/78  Pulse: (!) 111  Resp: 18  Temp: 98 F (36.7 C)  SpO2: 97%    General: Awake, no distress.  CV:  Good peripheral perfusion.  Regular rate and rhythm  Resp:  Normal effort.  Equal breath sounds bilaterally.  Abd:  No distention.  Soft, nontender.  No rebound or guarding.   ED Results / Procedures / Treatments   MEDICATIONS ORDERED IN ED: Medications  azithromycin (ZITHROMAX) tablet 1,000 mg (has no administration in time range)  cefTRIAXone (ROCEPHIN) injection 250 mg (has no administration in time range)     IMPRESSION / MDM / ASSESSMENT AND PLAN / ED  COURSE  I reviewed the triage vital signs and the nursing notes.  Patient's presentation is most consistent with acute presentation with potential threat to life or bodily function.  Patient presents emergency department for lower abdominal discomfort over the past 3 to 4 days.  Denies any vaginal discharge is currently on her menstrual cycle.  Denies any dysuria.  Patient states she went to the health department approximately 10 days ago had a pelvic exam performed and does not want a repeat pelvic exam.  Patient is agreeable to self swab as she has not yet heard the results from her testing at the health department.  Given the patient's discomfort we will check labs wet prep and gonorrhea/chlamydia.  Patient's lab work has resulted showing slight leukocytosis of 12,000 otherwise reassuring CBC, urinalysis appears to be contaminated but no obvious sign of infection.  Chemistry shows no concerning finding.  Lipase is normal.  Patient's wet prep is positive for clue cells.  We will treat with Flagyl for bacterial vaginitis.  I discussed with the patient waiting for gonorrhea/chlamydia test, patient states she wishes to go home  but we will call her if they are positive.  Patient is agreeable to STD treatment as a precaution in the emergency department including Zithromax and IM Rocephin.  I discussed with the patient if her STD testing comes back positive she may need to return to the emergency department or we could possibly call in a prescription of medication.  Patient is to follow-up on her STD testing on MyChart.  Remainder the work-up is negative.  Patient will be discharged home with PCP follow-up.  FINAL CLINICAL IMPRESSION(S) / ED DIAGNOSES   Abdominal pain Bacterial vaginitis  Note:  This document was prepared using Dragon voice recognition software and may include unintentional dictation errors.   Minna Antis, MD 06/29/22 1408

## 2022-06-29 NOTE — Telephone Encounter (Signed)
Calling pt re positive gonorrhea result from 06/17/22 vaginal specimen.  Pt needs tx appt.   Phone call to pt at [phone]. Pt confirmed password. Counseled pt re + GC result. Pt states NKA.  Tx appt scheduled for 06/30/22.

## 2022-06-30 ENCOUNTER — Ambulatory Visit: Payer: Medicaid Other

## 2022-06-30 DIAGNOSIS — Z719 Counseling, unspecified: Secondary | ICD-10-CM

## 2022-06-30 NOTE — Progress Notes (Signed)
In nurse clinic and was scheduled for [facility] treatment today.  Was seen at [facility] ED 06/29/22 with abdominal pain (see report).  Was treated with Ceftriaxone 250 mg IM and Azithromycin 1000 mg while there.  States she started taking her metronidazole for BV yesterday.   Came to our clinic as scheduled today stating she did not know what to do since she had already been treated.  See test result flowsheet and instructions.  To RTC in 3 months for GC TOC; appt reminder given to call for appt.     Informed Gregary Cromer FNP of treatment in ED and pt to return here for TOC.  Tonny Branch, RN

## 2022-07-02 NOTE — Progress Notes (Signed)
Consulted by RN re: patient situation.  Reviewed RN note and agree that it reflects our discussion and my recommendations. Stewart Pimenta, FNP  

## 2022-09-27 NOTE — L&D Delivery Note (Signed)
Delivery Note   Adiella, Laflash Girl A Clarita Crane [[id number]]  At 12:20 AM a viable and healthy female was delivered via Vaginal, Spontaneous (Presentation:vertex).  APGAR:8 , 9; weight  .   Placenta status: spontaneous,intact .  Cord: 3VC with the following complications: none.  Anesthesia:  None Episiotomy: None Lacerations: None Est. Blood Loss (mL): 200    Roxann, Massaro [[id number]]  At 12:33 AM a viable and healthy female was delivered via Vaginal, Spontaneous (Presentation: complete breech).  APGAR:8 ,9; weight  .   Placenta status: spontaneous,intact .  Cord: 3VC with the following complications: body cord around left foot  Anesthesia:  None Episiotomy: None Lacerations: None Est. Blood Loss (mL): 200   Mom to postpartum.   Baby A to Couplet care / Skin to Skin.   Baby B to Couplet care / Skin to Skin.  I was called to the OR for unmedicated and precipitous spontaneous vaginal delivery, delivering baby B breech with standard maneuvers and without complication, with AROM during delivery of the feet. Placentas delivered spontaneously intact. CNM Oxley delivered baby A in vertex presentation.  Christeen Douglas 05/17/2023, 1:02 AM

## 2022-11-23 LAB — OB RESULTS CONSOLE HEPATITIS B SURFACE ANTIGEN: Hepatitis B Surface Ag: NEGATIVE

## 2022-11-23 LAB — OB RESULTS CONSOLE VARICELLA ZOSTER ANTIBODY, IGG: Varicella: IMMUNE

## 2022-11-23 LAB — OB RESULTS CONSOLE RUBELLA ANTIBODY, IGM: Rubella: IMMUNE

## 2022-11-23 LAB — OB RESULTS CONSOLE HIV ANTIBODY (ROUTINE TESTING): HIV: NONREACTIVE

## 2023-01-03 ENCOUNTER — Other Ambulatory Visit: Payer: Self-pay | Admitting: Certified Nurse Midwife

## 2023-01-03 DIAGNOSIS — Z3689 Encounter for other specified antenatal screening: Secondary | ICD-10-CM

## 2023-01-20 ENCOUNTER — Emergency Department
Admission: EM | Admit: 2023-01-20 | Discharge: 2023-01-20 | Disposition: A | Discharge status: Home / Self Care | Payer: BC Managed Care – PPO | Attending: Emergency Medicine | Admitting: Emergency Medicine

## 2023-01-20 ENCOUNTER — Encounter: Payer: Self-pay | Admitting: Intensive Care

## 2023-01-20 ENCOUNTER — Other Ambulatory Visit: Payer: Self-pay

## 2023-01-20 DIAGNOSIS — E876 Hypokalemia: Secondary | ICD-10-CM | POA: Insufficient documentation

## 2023-01-20 DIAGNOSIS — O30042 Twin pregnancy, dichorionic/diamniotic, second trimester: Secondary | ICD-10-CM | POA: Insufficient documentation

## 2023-01-20 DIAGNOSIS — O99282 Endocrine, nutritional and metabolic diseases complicating pregnancy, second trimester: Secondary | ICD-10-CM | POA: Insufficient documentation

## 2023-01-20 DIAGNOSIS — R112 Nausea with vomiting, unspecified: Secondary | ICD-10-CM

## 2023-01-20 DIAGNOSIS — O219 Vomiting of pregnancy, unspecified: Secondary | ICD-10-CM | POA: Insufficient documentation

## 2023-01-20 DIAGNOSIS — O99012 Anemia complicating pregnancy, second trimester: Secondary | ICD-10-CM | POA: Insufficient documentation

## 2023-01-20 DIAGNOSIS — Z3A20 20 weeks gestation of pregnancy: Secondary | ICD-10-CM | POA: Diagnosis not present

## 2023-01-20 LAB — CBC WITH DIFFERENTIAL/PLATELET
Abs Immature Granulocytes: 0.05 10*3/uL (ref 0.00–0.07)
Basophils Absolute: 0 10*3/uL (ref 0.0–0.1)
Basophils Relative: 0 %
Eosinophils Absolute: 0.3 10*3/uL (ref 0.0–0.5)
Eosinophils Relative: 4 %
HCT: 33.6 % — ABNORMAL LOW (ref 36.0–46.0)
Hemoglobin: 10.4 g/dL — ABNORMAL LOW (ref 12.0–15.0)
Immature Granulocytes: 1 %
Lymphocytes Relative: 9 %
Lymphs Abs: 0.9 10*3/uL (ref 0.7–4.0)
MCH: 20.6 pg — ABNORMAL LOW (ref 26.0–34.0)
MCHC: 31 g/dL (ref 30.0–36.0)
MCV: 66.7 fL — ABNORMAL LOW (ref 80.0–100.0)
Monocytes Absolute: 0.7 10*3/uL (ref 0.1–1.0)
Monocytes Relative: 8 %
Neutro Abs: 7.3 10*3/uL (ref 1.7–7.7)
Neutrophils Relative %: 78 %
Platelets: 315 10*3/uL (ref 150–400)
RBC: 5.04 MIL/uL (ref 3.87–5.11)
RDW: 15.1 % (ref 11.5–15.5)
WBC: 9.2 10*3/uL (ref 4.0–10.5)
nRBC: 0 % (ref 0.0–0.2)

## 2023-01-20 LAB — URINALYSIS, ROUTINE W REFLEX MICROSCOPIC
Bilirubin Urine: NEGATIVE
Glucose, UA: NEGATIVE mg/dL
Hgb urine dipstick: NEGATIVE
Ketones, ur: 20 mg/dL — AB
Leukocytes,Ua: NEGATIVE
Nitrite: NEGATIVE
Protein, ur: NEGATIVE mg/dL
Specific Gravity, Urine: 1.012 (ref 1.005–1.030)
pH: 7 (ref 5.0–8.0)

## 2023-01-20 LAB — COMPREHENSIVE METABOLIC PANEL
ALT: 12 U/L (ref 0–44)
AST: 16 U/L (ref 15–41)
Albumin: 3 g/dL — ABNORMAL LOW (ref 3.5–5.0)
Alkaline Phosphatase: 54 U/L (ref 38–126)
Anion gap: 7 (ref 5–15)
BUN: 10 mg/dL (ref 6–20)
CO2: 22 mmol/L (ref 22–32)
Calcium: 8.4 mg/dL — ABNORMAL LOW (ref 8.9–10.3)
Chloride: 107 mmol/L (ref 98–111)
Creatinine, Ser: 0.55 mg/dL (ref 0.44–1.00)
GFR, Estimated: >60 mL/min (ref >60)
Glucose, Bld: 92 mg/dL (ref 70–99)
Potassium: 3.4 mmol/L — ABNORMAL LOW (ref 3.5–5.1)
Sodium: 136 mmol/L (ref 135–145)
Total Bilirubin: 0.4 mg/dL (ref 0.3–1.2)
Total Protein: 6.9 g/dL (ref 6.5–8.1)

## 2023-01-20 LAB — LIPASE, BLOOD: Lipase: 51 U/L (ref 11–51)

## 2023-01-20 MED ORDER — METOCLOPRAMIDE HCL 5 MG/ML IJ SOLN
10.0000 mg | Freq: Once | INTRAMUSCULAR | Status: AC
  Administered 2023-01-20: 10 mg via INTRAVENOUS
  Filled 2023-01-20: qty 2

## 2023-01-20 MED ORDER — DIPHENHYDRAMINE HCL 50 MG/ML IJ SOLN
25.0000 mg | Freq: Once | INTRAMUSCULAR | Status: AC
  Administered 2023-01-20: 25 mg via INTRAVENOUS
  Filled 2023-01-20: qty 1

## 2023-01-20 MED ORDER — LACTATED RINGERS IV BOLUS
1000.0000 mL | Freq: Once | INTRAVENOUS | Status: AC
  Administered 2023-01-20: 1000 mL via INTRAVENOUS

## 2023-01-20 NOTE — ED Provider Notes (Addendum)
[facility] Provider Note    Event Date/Time   First MD Initiated Contact with Patient 01/20/23 [phone]     (approximate)   History   Emesis   HPI  Sheila Sharp is a 32 y.o. female G3, P1 who is currently about [redacted] weeks pregnant with twins who presents with nausea and vomiting.  Symptoms started around 3 AM.  Patient's had between 5 and 10 episodes of nonbloody emesis.  She does endorse some bilateral side pain with vomiting only but is not having abdominal pain in between episodes of vomiting.  Denies diarrhea had normal BM this morning.  Has had some chills.  She ate same food as her husband last night and husband is having diarrhea this morning.  He has not vomited.  Patient denies loss of fluid vaginal bleeding or abnormal discharge.  She has not felt the baby's movement about a week prior to this was feeling fetal movement.  Patient has had some issues with nausea vomiting early on in pregnancy and has Zofran at home which she tried this morning but was not able to keep it down.  Previous episodes of emesis were not this severe.     History reviewed. No pertinent past medical history.  Patient Active Problem List   Diagnosis Date Noted   Obesity BMI=35 06/17/2022     Physical Exam  Triage Vital Signs: ED Triage Vitals  Enc Vitals Group     BP 01/20/23 0721 122/73     Pulse Rate 01/20/23 0721 83     Resp 01/20/23 0721 18     Temp 01/20/23 0721 99.3 F (37.4 C)     Temp Source 01/20/23 0721 Oral     SpO2 01/20/23 0721 98 %     Weight 01/20/23 0718 256 lb (116.1 kg)     Height 01/20/23 0718  (1.854 m)     Head Circumference --      Peak Flow --      Pain Score 01/20/23 0718 0     Pain Loc --      Pain Edu? --      Excl. in [place]? --     Most recent vital signs: Vitals:   01/20/23 0721  BP: 122/73  Pulse: 83  Resp: 18  Temp: 99.3 F (37.4 C)  SpO2: 98%     General: Awake, no distress.  CV:  Good peripheral perfusion.   Resp:  Normal effort.  Abd:  No distention.  Gravid uterus abdomen soft nontender throughout Neuro:             Awake, Alert, Oriented x 3  Other:  Mucous membranes are dry   ED Results / Procedures / Treatments  Labs (all labs ordered are listed, but only abnormal results are displayed) Labs Reviewed  COMPREHENSIVE METABOLIC PANEL - Abnormal; Notable for the following components:      Result Value   Potassium 3.4 (*)    Calcium 8.4 (*)    Albumin 3.0 (*)    All other components within normal limits  CBC WITH DIFFERENTIAL/PLATELET - Abnormal; Notable for the following components:   Hemoglobin 10.4 (*)    HCT 33.6 (*)    MCV 66.7 (*)    MCH 20.6 (*)    All other components within normal limits  URINALYSIS, ROUTINE W REFLEX MICROSCOPIC - Abnormal; Notable for the following components:   Color, Urine YELLOW (*)    APPearance CLEAR (*)    Ketones,  ur 20 (*)    All other components within normal limits  LIPASE, BLOOD     EKG     RADIOLOGY I performed a bedside ultrasound which shows 2 intrauterine pregnancies, fetal movement of both, heart rate of fetus #1 is 152 heart rate of fetus #2 was 145   PROCEDURES:  Critical Care performed: No  Procedures   MEDICATIONS ORDERED IN ED: Medications  lactated ringers bolus 1,000 mL (1,000 mLs Intravenous New Bag/Given 01/20/23 0819)  metoCLOPramide (REGLAN) injection 10 mg (10 mg Intravenous Given 01/20/23 0819)  diphenhydrAMINE (BENADRYL) injection 25 mg (25 mg Intravenous Given 01/20/23 0819)     IMPRESSION / MDM / ASSESSMENT AND PLAN / ED COURSE  I reviewed the triage vital signs and the nursing notes.                              Patient's presentation is most consistent with acute complicated illness / injury requiring diagnostic workup.  Differential diagnosis includes, but is not limited to, gastroenteritis, food poisoning, biliary colic, pancreatitis, hyperemesis gravidarum  Patient is a 32 year old female who is  currently [redacted] weeks pregnant with twins who presents with nausea vomiting since 3 AM.  Patient has had between 5 and 10 episodes of nonbilious nonbloody emesis with associated abdominal/side pain just with episodes of emesis but no pain in between.  Has also had chills.  No urinary symptoms no vaginal bleeding no loss of fluid.  Patient's vital signs are reassuring blood pressure is appropriate.  Overall she looks well does have some dry mucous membranes but abdominal exam is benign.  Patient's husband is having diarrhea ate the same meal as her last night.  She is later on in pregnancy than I would expect for typical hyperemesis and I am more suspicious that this is an acute gastroenteritis versus food poisoning.  Will check labs including CBC CMP lipase and give a bolus of fluid Reglan Benadryl and perform bedside ultrasound fetus given decreased fetal movement.  Performed bedside ultrasound which shows 2 intrauterine pregnancies with fetal movement and normal fetal heart tones.  Patient was feeling improved after bolus Reglan and Benadryl.  She is tolerating p.o. has not vomited in the ER.  Blood work overall is reassuring no evidence of UTI.  CBC with mild anemia CMP mild hypokalemia no AKI normal bicarb.  Overall my suspicion that this is either viral or food poisoning.  Did discuss with her that if she continues to have nausea vomiting is unable to tolerate p.o. despite the Zofran she has at home that she needs to return to the ER.  Recommended clear liquid diet until improved and then advancing as tolerated.  Also discussed return precautions for abdominal pain and vaginal bleeding.  She is appropriate for discharge.  I did speak with nurse midwife with [facility] about whether patient needs any further monitoring given decreased fetal movement and she did not feel that she needed any additional monitoring at this time given she is 20 weeks and that there is normal fetal heart tones on my exam.        FINAL CLINICAL IMPRESSION(S) / ED DIAGNOSES   Final diagnoses:  Nausea and vomiting, unspecified vomiting type     Rx / DC Orders   ED Discharge Orders     None        Note:  This document was prepared using Dragon voice recognition software and may include unintentional dictation  errors.   Georga Hacking, MD 01/20/23 1191    Georga Hacking, MD 01/20/23 1019

## 2023-01-20 NOTE — Discharge Instructions (Addendum)
Stick to clear liquids today until you are feeling improved and then he can introduce bland solids.  You can take the Zofran as needed for nausea and vomiting.  If you are not able to keep fluids down or you develop significant abdominal pain or vaginal bleeding then return to the emergency department.  If you are not feeling improved tomorrow please follow-up with [facility] clinic OB/GYN and they can repeat your ultrasound.

## 2023-01-20 NOTE — ED Triage Notes (Addendum)
Patient reports emesis since 0300. Unable to keep anything down. Reports she is [redacted] weeks pregnant with twins

## 2023-01-20 NOTE — ED Notes (Signed)
Pt discharge to home. Pt VSS, GCS 15, NAD. Pt verbalized understanding of discharge instructions with no additional questions at this time.  

## 2023-01-30 ENCOUNTER — Other Ambulatory Visit: Payer: Self-pay | Admitting: Certified Nurse Midwife

## 2023-01-30 DIAGNOSIS — Z3689 Encounter for other specified antenatal screening: Secondary | ICD-10-CM

## 2023-01-31 ENCOUNTER — Other Ambulatory Visit: Payer: Self-pay

## 2023-02-02 ENCOUNTER — Ambulatory Visit: Payer: BC Managed Care – PPO | Attending: Maternal & Fetal Medicine

## 2023-02-02 ENCOUNTER — Ambulatory Visit (HOSPITAL_BASED_OUTPATIENT_CLINIC_OR_DEPARTMENT_OTHER): Payer: BC Managed Care – PPO | Admitting: Maternal & Fetal Medicine

## 2023-02-02 ENCOUNTER — Other Ambulatory Visit: Payer: Self-pay

## 2023-02-02 DIAGNOSIS — O30042 Twin pregnancy, dichorionic/diamniotic, second trimester: Secondary | ICD-10-CM | POA: Diagnosis present

## 2023-02-02 DIAGNOSIS — O99212 Obesity complicating pregnancy, second trimester: Secondary | ICD-10-CM | POA: Diagnosis not present

## 2023-02-02 DIAGNOSIS — Z3A21 21 weeks gestation of pregnancy: Secondary | ICD-10-CM

## 2023-02-02 DIAGNOSIS — Z6835 Body mass index (BMI) 35.0-35.9, adult: Secondary | ICD-10-CM

## 2023-02-02 DIAGNOSIS — E669 Obesity, unspecified: Secondary | ICD-10-CM | POA: Diagnosis not present

## 2023-02-02 DIAGNOSIS — Z3689 Encounter for other specified antenatal screening: Secondary | ICD-10-CM

## 2023-02-02 NOTE — Progress Notes (Signed)
MFM consultation  Sheila Sharp is a 32 yo G3P2 who is seen at 71 w 5d with an EDD of 91/3/24. She is sen at the request of Herb Grays CNM for a new diamniotic dichorionic twin pregnancy.  Sheila Sharp is doing well she denies s/sx of preeclampsia or preterm labor.  Her prenatal care has been overall normal she had and elevated hgba1c of 6% but normal 1hr GTT. Her CMP, UPC and CBC has been normal as well as her AFP and hgb electrophoresis.  She reports having an LR NIPT however, I do not have this report available at the time of this consultation.   She receives care with [facility] clinic     02/02/2023   10:06 AM 01/20/2023   10:24 AM 01/20/2023    7:21 AM  Vitals with BMI  Height 6\' 1"     Weight 261 lbs 8 oz    BMI 34.51    Systolic 125 108 161  Diastolic 63 65 73  Pulse 91 80 83   OB History  Gravida Para Term Preterm AB Living  3 1 1  0 0 2  SAB IAB Ectopic Multiple Live Births  0 0 0 0 2    # Outcome Date GA Lbr Len/2nd Weight Sex Delivery Anes PTL Lv  3 Current           2 Gravida 03/31/21    M Vag-Spont  N LIV     Complications: Spinal puncture headache  1 Term 09/08/19 [redacted]w[redacted]d / 00:36 3170 g M Vag-Spont EPI  LIV     Name: Alethia Berthold     Apgar1: 8  Apgar5: 9   No past medical history on file. No past surgical history on file. No family history on file.     Current Outpatient Medications (Respiratory):    albuterol (PROVENTIL HFA;VENTOLIN HFA) 108 (90 Base) MCG/ACT inhaler, Inhale 1-2 puffs into the lungs every 6 (six) hours as needed for wheezing or shortness of breath.    Current Outpatient Medications (Other):    ondansetron (ZOFRAN-ODT) 4 MG disintegrating tablet, Take 4 mg by mouth every 8 (eight) hours as needed for nausea or vomiting.   Prenatal Vit-Fe Fumarate-FA (MULTIVITAMIN-PRENATAL) 27-0.8 MG TABS tablet, Take 1 tablet by mouth daily at 12 noon. (Patient not taking: Reported on 02/02/2023) No Known Allergies   Imaging:  Sheila Sharp  is a G3P2  who is here for a detailed examination for DiDi twin pregnancy. Normal anatomy with good amniotic fluid and fetal movement was observed in Twin A and B. Suboptimal views of the fetal anatomy were obtained secondary to fetal position  Twin discordance of 6%. No markers of aneuploidy were observed.  Please see EPIC for MFM Consultation  Impression/Counseling:  1) Diamniotic Dichorionic twin pregnancy  We reviewed the sonographic findings and limitations of ultrasound. The potential risks associated with a twin gestation were discussed.  This discussion included a review of the increased risk of miscarriages, anomalies, preterm labor, and/or delivery, malpresentation, delivery via cesarean section, gestational diabetes, and/or preeclampsia.  We recommend 2x daily low dose ASA for preeclampsia prevention  With regards to fetal risks, there is an increased risk for fetal growth restriction of one or both twins, preterm labor, and associated morbidity, and intrauterine fetal demise.    We recommend growth scans every 4 weeks starting at 24 weeks with the initation of weekly antenatal testing in the form of twice weekly NST or weekly BPP should abnormal fetal growth or intertwin discordance of  greater than 20-25% is noted.   Following counseling, all questions were addressed.    I spent 30 minutes with > 50% in face to face consultation   Novella Olive, MD

## 2023-03-03 ENCOUNTER — Observation Stay
Admission: EM | Admit: 2023-03-03 | Discharge: 2023-03-03 | Disposition: A | Discharge status: Home / Self Care | Payer: BC Managed Care – PPO | Attending: Obstetrics and Gynecology | Admitting: Obstetrics and Gynecology

## 2023-03-03 ENCOUNTER — Encounter: Payer: Self-pay | Admitting: Obstetrics and Gynecology

## 2023-03-03 ENCOUNTER — Other Ambulatory Visit: Payer: Self-pay

## 2023-03-03 ENCOUNTER — Observation Stay: Payer: BC Managed Care – PPO

## 2023-03-03 DIAGNOSIS — R102 Pelvic and perineal pain: Secondary | ICD-10-CM | POA: Diagnosis not present

## 2023-03-03 DIAGNOSIS — O26892 Other specified pregnancy related conditions, second trimester: Secondary | ICD-10-CM | POA: Diagnosis not present

## 2023-03-03 DIAGNOSIS — Z3A25 25 weeks gestation of pregnancy: Secondary | ICD-10-CM | POA: Insufficient documentation

## 2023-03-03 DIAGNOSIS — O30042 Twin pregnancy, dichorionic/diamniotic, second trimester: Secondary | ICD-10-CM | POA: Diagnosis not present

## 2023-03-03 HISTORY — DX: Other specified health status: Z78.9

## 2023-03-03 LAB — WET PREP, GENITAL
Clue Cells Wet Prep HPF POC: NONE SEEN
Sperm: NONE SEEN
Trich, Wet Prep: NONE SEEN
WBC, Wet Prep HPF POC: <10 (ref <10)
Yeast Wet Prep HPF POC: NONE SEEN

## 2023-03-03 NOTE — OB Triage Note (Signed)
Discharge instructions, labor precautions, and follow-up care reviewed with patient. Pending results; CNM to notify patient. All questions answered. Patient verbalized understanding. Discharged ambulatory off unit.

## 2023-03-03 NOTE — Progress Notes (Signed)
Pt presents to L/D triage with reported intermittent vaginal pressure rated 4/10. Pt reports pain is worse with movement/ambulating. Pt reports increase of white/clear discharge. No bleeding or gushes of fluid, positive fetal movement. Baby A and Baby B FHTs auscultated.  VSS.  CNM notified of patient's arrival. Wet prep collected.

## 2023-03-03 NOTE — Discharge Summary (Signed)
Patient ID: Sheila Sharp MRN: [id number] DOB/AGE: [birth year] 31 y.o.  Admit date: 03/03/2023 Discharge date: 03/03/2023  Admission Diagnoses: 32yo G3P2 with twin pregnancy report cramping.  Discharge Diagnoses: Cervix closed and 4.0 cm by ultrasound  Factors complicating pregnancy: Multi-fetal gestation - Di/Di twins Elevated A1c of 6.2 on 10/14/22 dx at PCP for Pre-Diabetes on 10/14/22 Obesity BMI: 37.39   Prenatal Procedures: none  Consults: None  Significant Diagnostic Studies:  Results for orders placed or performed during the hospital encounter of 03/03/23 (from the past 168 hour(s))  Wet prep, genital   Collection Time: 03/03/23 12:54 PM   Specimen: Vaginal  Result Value Ref Range   Yeast Wet Prep HPF POC NONE SEEN NONE SEEN   Trich, Wet Prep NONE SEEN NONE SEEN   Clue Cells Wet Prep HPF POC NONE SEEN NONE SEEN   WBC, Wet Prep HPF POC <10 <10   Sperm NONE SEEN     Treatments: none  Hospital Course:  This is a 32 y.o. G3P1002 with IUP at [redacted]w[redacted]d seen for cramping.  No leaking of fluid and no bleeding.  U/s completed. Wet prep collected.  FHT was appropriate for GA x 2 and toco remained quiet. She was observed, fetal heart rate monitoring remained reassuring, and she had no signs/symptoms of preterm labor or other maternal-fetal concerns.  She was deemed stable for discharge to home with outpatient follow up.  Discharge Physical Exam:  BP 126/69   Pulse 95   Temp 99 F (37.2 C) (Oral)   Resp 18   Ht 6\' 1"  (1.854 m)   Wt 117.9 kg   LMP 06/28/2022 (Exact Date)   BMI 34.30 kg/m   General: NAD CV: RRR Pulm: CTABL, nl effort ABD: s/nd/nt, gravid DVT Evaluation: LE non-ttp, no evidence of DVT on exam.   Discharge Condition: Stable  Disposition: Discharge disposition: 01-Home or Self Care        Allergies as of 03/03/2023   No Known Allergies      Medication List     TAKE these medications    albuterol 108 (90 Base) MCG/ACT inhaler Commonly known  as: VENTOLIN HFA Inhale 1-2 puffs into the lungs every 6 (six) hours as needed for wheezing or shortness of breath.   aspirin EC 81 MG tablet Take 81 mg by mouth daily. Swallow whole.   multivitamin-prenatal 27-0.8 MG Tabs tablet Take 1 tablet by mouth daily at 12 noon.   ondansetron 4 MG disintegrating tablet Commonly known as: ZOFRAN-ODT Take 4 mg by mouth every 8 (eight) hours as needed for nausea or vomiting.   pantoprazole 20 MG tablet Commonly known as: PROTONIX Take 20 mg by mouth daily.   prenatal multivitamin Tabs tablet Take 1 tablet by mouth daily at 12 noon.        Follow-up Information     [facility] Follow up.   Why: Keep all scheduled appointments Contact information: [address] [place] [address] [phone]                Signed:  Quillian Quince 03/03/2023 3:51 PM

## 2023-03-04 ENCOUNTER — Other Ambulatory Visit: Payer: Self-pay | Admitting: Maternal & Fetal Medicine

## 2023-03-04 DIAGNOSIS — O30042 Twin pregnancy, dichorionic/diamniotic, second trimester: Secondary | ICD-10-CM

## 2023-03-09 ENCOUNTER — Other Ambulatory Visit: Payer: Self-pay

## 2023-03-09 ENCOUNTER — Ambulatory Visit: Payer: BC Managed Care – PPO | Attending: Obstetrics

## 2023-03-09 DIAGNOSIS — O99212 Obesity complicating pregnancy, second trimester: Secondary | ICD-10-CM | POA: Diagnosis not present

## 2023-03-09 DIAGNOSIS — O358XX2 Maternal care for other (suspected) fetal abnormality and damage, fetus 2: Secondary | ICD-10-CM

## 2023-03-09 DIAGNOSIS — O358XX1 Maternal care for other (suspected) fetal abnormality and damage, fetus 1: Secondary | ICD-10-CM | POA: Diagnosis not present

## 2023-03-09 DIAGNOSIS — O321XX0 Maternal care for breech presentation, not applicable or unspecified: Secondary | ICD-10-CM | POA: Diagnosis not present

## 2023-03-09 DIAGNOSIS — O30042 Twin pregnancy, dichorionic/diamniotic, second trimester: Secondary | ICD-10-CM | POA: Diagnosis present

## 2023-03-09 DIAGNOSIS — Z3A26 26 weeks gestation of pregnancy: Secondary | ICD-10-CM | POA: Insufficient documentation

## 2023-03-09 DIAGNOSIS — E669 Obesity, unspecified: Secondary | ICD-10-CM | POA: Diagnosis not present

## 2023-03-16 ENCOUNTER — Telehealth: Payer: Self-pay | Admitting: Obstetrics and Gynecology

## 2023-03-16 NOTE — Telephone Encounter (Signed)
Left voicemail for patient to call back regarding Panorama testing for zygosity for this pregnancy.  Per Dr. Parke Poisson, the patient had prior cell free DNA testing (likely through [email or web address] - though I cannot locate those results in EPIC or CareEverywhere). Left message stating that we can order this at her next u/s visit on 7/15 if desired.  We can be reached at [phone].  Cherly Anderson, MS, CGC

## 2023-04-06 ENCOUNTER — Other Ambulatory Visit: Payer: Self-pay

## 2023-04-06 DIAGNOSIS — E66812 Obesity, class 2: Secondary | ICD-10-CM

## 2023-04-06 DIAGNOSIS — E669 Obesity, unspecified: Secondary | ICD-10-CM

## 2023-04-06 DIAGNOSIS — O30042 Twin pregnancy, dichorionic/diamniotic, second trimester: Secondary | ICD-10-CM

## 2023-04-07 ENCOUNTER — Other Ambulatory Visit: Payer: Self-pay | Admitting: Obstetrics and Gynecology

## 2023-04-07 DIAGNOSIS — D508 Other iron deficiency anemias: Secondary | ICD-10-CM

## 2023-04-07 MED ORDER — SODIUM CHLORIDE 0.9 % IV SOLN
300.0000 mg | INTRAVENOUS | Status: AC
  Filled 2023-04-07 (×3): qty 15

## 2023-04-07 NOTE — Progress Notes (Unsigned)
Pregnancy associated  iron deficent anemia - twin gestation

## 2023-04-11 ENCOUNTER — Ambulatory Visit: Payer: BC Managed Care – PPO | Attending: Obstetrics and Gynecology

## 2023-04-11 ENCOUNTER — Other Ambulatory Visit: Payer: Self-pay

## 2023-04-11 DIAGNOSIS — O99012 Anemia complicating pregnancy, second trimester: Secondary | ICD-10-CM | POA: Diagnosis not present

## 2023-04-11 DIAGNOSIS — Z6835 Body mass index (BMI) 35.0-35.9, adult: Secondary | ICD-10-CM

## 2023-04-11 DIAGNOSIS — O99212 Obesity complicating pregnancy, second trimester: Secondary | ICD-10-CM | POA: Diagnosis not present

## 2023-04-11 DIAGNOSIS — O30043 Twin pregnancy, dichorionic/diamniotic, third trimester: Secondary | ICD-10-CM | POA: Diagnosis not present

## 2023-04-11 DIAGNOSIS — Z3A31 31 weeks gestation of pregnancy: Secondary | ICD-10-CM | POA: Diagnosis not present

## 2023-04-11 DIAGNOSIS — E669 Obesity, unspecified: Secondary | ICD-10-CM | POA: Diagnosis not present

## 2023-04-11 DIAGNOSIS — O30042 Twin pregnancy, dichorionic/diamniotic, second trimester: Secondary | ICD-10-CM | POA: Diagnosis not present

## 2023-04-11 DIAGNOSIS — O99213 Obesity complicating pregnancy, third trimester: Secondary | ICD-10-CM | POA: Diagnosis not present

## 2023-04-12 ENCOUNTER — Ambulatory Visit
Admission: RE | Admit: 2023-04-12 | Discharge: 2023-04-12 | Disposition: A | Discharge status: Home / Self Care | Payer: BC Managed Care – PPO | Source: Ambulatory Visit | Attending: Obstetrics and Gynecology | Admitting: Obstetrics and Gynecology

## 2023-04-12 DIAGNOSIS — D508 Other iron deficiency anemias: Secondary | ICD-10-CM | POA: Insufficient documentation

## 2023-04-12 DIAGNOSIS — O30043 Twin pregnancy, dichorionic/diamniotic, third trimester: Secondary | ICD-10-CM | POA: Diagnosis not present

## 2023-04-12 MED ORDER — SODIUM CHLORIDE 0.9 % IV SOLN
300.0000 mg | Freq: Once | INTRAVENOUS | Status: AC
  Administered 2023-04-12: 300 mg via INTRAVENOUS
  Filled 2023-04-12: qty 300

## 2023-04-20 ENCOUNTER — Observation Stay
Admission: EM | Admit: 2023-04-20 | Discharge: 2023-04-20 | Disposition: A | Discharge status: Home / Self Care | Payer: BC Managed Care – PPO | Attending: Obstetrics and Gynecology | Admitting: Obstetrics and Gynecology

## 2023-04-20 ENCOUNTER — Inpatient Hospital Stay: Payer: BC Managed Care – PPO

## 2023-04-20 ENCOUNTER — Encounter: Payer: Self-pay | Admitting: Obstetrics and Gynecology

## 2023-04-20 ENCOUNTER — Other Ambulatory Visit: Payer: Self-pay

## 2023-04-20 DIAGNOSIS — O30043 Twin pregnancy, dichorionic/diamniotic, third trimester: Secondary | ICD-10-CM | POA: Insufficient documentation

## 2023-04-20 DIAGNOSIS — O368130 Decreased fetal movements, third trimester, not applicable or unspecified: Secondary | ICD-10-CM | POA: Diagnosis present

## 2023-04-20 DIAGNOSIS — Z3A32 32 weeks gestation of pregnancy: Secondary | ICD-10-CM | POA: Insufficient documentation

## 2023-04-20 DIAGNOSIS — O368190 Decreased fetal movements, unspecified trimester, not applicable or unspecified: Principal | ICD-10-CM | POA: Diagnosis present

## 2023-04-20 NOTE — OB Triage Note (Signed)
Patient given discharge instructions including follow up information, preterm labor precautions, and fetal kick count instructions. Patient to return to hospital for painful contractions, decreased fetal movement, leaking of fluid, or vaginal bleeding. Patient verbalized understanding and agreed to plan. Patient discharged in stable condition, ambulates independently with support person at side.

## 2023-04-20 NOTE — Discharge Summary (Signed)
Patient ID: Sheila Sharp MRN: [id number] DOB/AGE: [birth year] 32 y.o.  Admit date: 04/20/2023 Discharge date: 04/20/2023  Admission Diagnoses: 32yo G3P2 at [redacted]w[redacted]d of a Di/Di twin gestation presents with concerns of decrease fetal movement.  Discharge Diagnoses: RNST x 2  Factors complicating pregnancy: Multi-fetal gestation - Di/Di twins Obesity BMI: 37.39. Iron deficient anemia - ice Pica , MCV 66 04/07/23 ordered Venofer weekly x3   Prenatal Procedures: NST and ultrasound  Consults: None  Significant Diagnostic Studies:  US OB Limited  Result Date: 04/20/2023 CLINICAL DATA:  Decreased fetal motion EXAM: LIMITED OBSTETRIC ULTRASOUND FINDINGS: Number of Fetuses:  2 Separating Membrane: Visualized, dichorionic/diamniotic TWIN 1 Heart Rate:  133 bpm Movement: Present Presentation: Variable Placental Location: Anterior Previa: Absent Amniotic Fluid (Subjective):  Within normal limits. BPD:  8.18cm 32w 6d TWIN 2 Heart Rate:  150 bpm Movement: Present Presentation: Variable Placental Location: Anterior Previa: None Amniotic Fluid (Subjective): Within normal limits. BPD:  8.17 cmcm 32w 6d MATERNAL FINDINGS: Cervix:  Appears closed. Uterus/Adnexae: No abnormality visualized. IMPRESSION: Twin pregnancy without acute complicating factors. Fetal motion is noted in both twins. This exam is performed on an emergent basis and does not comprehensively evaluate fetal size, dating, or anatomy; follow-up complete OB US should be considered if further fetal assessment is warranted Electronically Signed   By: Alcide Clever M.D.   On: 04/20/2023 19:39   US OB Limited  Result Date: 04/20/2023 CLINICAL DATA:  Decreased fetal motion EXAM: LIMITED OBSTETRIC ULTRASOUND FINDINGS: Number of Fetuses:  2 Separating Membrane: Visualized, dichorionic/diamniotic TWIN 1 Heart Rate:  133 bpm Movement: Present Presentation: Variable Placental Location: Anterior Previa: Absent Amniotic Fluid (Subjective):  Within normal limits.  BPD:  8.18cm 32w 6d TWIN 2 Heart Rate:  150 bpm Movement: Present Presentation: Variable Placental Location: Anterior Previa: None Amniotic Fluid (Subjective): Within normal limits. BPD:  8.17 cmcm 32w 6d MATERNAL FINDINGS: Cervix:  Appears closed. Uterus/Adnexae: No abnormality visualized. IMPRESSION: Twin pregnancy without acute complicating factors. Fetal motion is noted in both twins. This exam is performed on an emergent basis and does not comprehensively evaluate fetal size, dating, or anatomy; follow-up complete OB US should be considered if further fetal assessment is warranted Electronically Signed   By: Alcide Clever M.D.   On: 04/20/2023 19:39     Treatments: none  Hospital Course:  This is a 32 y.o. [id number] with IUP at [redacted]w[redacted]d seen for decreased fetal movement.  No leaking of fluid and no contractions, no bleeding.  NST was reactive x2 and patient requested u/s to feel more reassured.  U/S was within normal limites. She was observed, fetal heart rate monitoring remained reassuring, and she had no signs/symptoms of labor or other maternal-fetal concerns.  She was deemed stable for discharge to home with outpatient follow up.  Discharge Physical Exam:  BP 113/66 (BP Location: Right Arm)   Pulse 88   Temp 98.9 F (37.2 C) (Oral)   Resp 15   Ht 6' (1.829 m)   Wt 118.8 kg   LMP 06/28/2022 (Exact Date)   BMI 35.53 kg/m   General: NAD CV: RRR Pulm: nl effort ABD: s/nd/nt, gravid DVT Evaluation: LE non-ttp, no evidence of DVT on exam.  NST: FHR baseline: 150 bpm Variability: moderate Accelerations: yes Decelerations: none Category/reactivity: reactive  FHR baseline: 135 bpm Variability: moderate Accelerations: yes Decelerations: none Category/reactivity: reactive  TOCO: quiet SVE: deferred      Discharge Condition: Stable  Disposition:  Discharge disposition: 01-Home or Self Care  Allergies as of 04/20/2023   No Known Allergies      Medication List      STOP taking these medications    multivitamin-prenatal 27-0.8 MG Tabs tablet       TAKE these medications    albuterol 108 (90 Base) MCG/ACT inhaler Commonly known as: VENTOLIN HFA Inhale 1-2 puffs into the lungs every 6 (six) hours as needed for wheezing or shortness of breath.   aspirin EC 81 MG tablet Take 81 mg by mouth daily. Swallow whole.   ondansetron 4 MG disintegrating tablet Commonly known as: ZOFRAN-ODT Take 4 mg by mouth every 8 (eight) hours as needed for nausea or vomiting.   pantoprazole 20 MG tablet Commonly known as: PROTONIX Take 20 mg by mouth daily.   prenatal multivitamin Tabs tablet Take 1 tablet by mouth daily at 12 noon.        Follow-up Information     [facility] Follow up.   Why: Keep all scheduled appointments Contact information: [address] [place] [address] [phone]                Signed:  Quillian Quince 04/20/2023 8:37 PM

## 2023-04-20 NOTE — OB Triage Note (Signed)
Patient is a [id number] at [redacted]w[redacted]d with di-di twins who presents to unit c/o decreased fetal movement today. Reports she did not feel them move at all yesterday and has only felt them move a couple of times today. Reports occasional Deberah Pelton an denies LOF and vaginal bleeding. External monitors applied and assessing. Baby A FHT 155. Baby B FHT 135. Vital signs WDL.

## 2023-04-20 NOTE — Progress Notes (Signed)
Patient to ultrasound

## 2023-04-21 ENCOUNTER — Ambulatory Visit
Admission: RE | Admit: 2023-04-21 | Discharge: 2023-04-21 | Disposition: A | Discharge status: Home / Self Care | Payer: BC Managed Care – PPO | Source: Ambulatory Visit | Attending: Obstetrics and Gynecology | Admitting: Obstetrics and Gynecology

## 2023-04-21 DIAGNOSIS — Z3A32 32 weeks gestation of pregnancy: Secondary | ICD-10-CM | POA: Insufficient documentation

## 2023-04-21 DIAGNOSIS — D509 Iron deficiency anemia, unspecified: Secondary | ICD-10-CM | POA: Diagnosis not present

## 2023-04-21 DIAGNOSIS — O99013 Anemia complicating pregnancy, third trimester: Secondary | ICD-10-CM | POA: Diagnosis not present

## 2023-04-21 MED ORDER — SODIUM CHLORIDE 0.9 % IV SOLN
300.0000 mg | Freq: Once | INTRAVENOUS | Status: AC
  Administered 2023-04-21: 300 mg via INTRAVENOUS
  Filled 2023-04-21: qty 100

## 2023-04-22 ENCOUNTER — Inpatient Hospital Stay: Admission: RE | Admit: 2023-04-22 | Payer: BC Managed Care – PPO | Source: Ambulatory Visit

## 2023-04-29 ENCOUNTER — Ambulatory Visit
Admission: RE | Admit: 2023-04-29 | Discharge: 2023-04-29 | Disposition: A | Discharge status: Home / Self Care | Payer: BC Managed Care – PPO | Source: Ambulatory Visit | Attending: Obstetrics and Gynecology | Admitting: Obstetrics and Gynecology

## 2023-04-29 DIAGNOSIS — O99013 Anemia complicating pregnancy, third trimester: Secondary | ICD-10-CM | POA: Diagnosis not present

## 2023-04-29 DIAGNOSIS — D508 Other iron deficiency anemias: Secondary | ICD-10-CM | POA: Insufficient documentation

## 2023-04-29 DIAGNOSIS — Z3A00 Weeks of gestation of pregnancy not specified: Secondary | ICD-10-CM | POA: Insufficient documentation

## 2023-04-29 DIAGNOSIS — O30042 Twin pregnancy, dichorionic/diamniotic, second trimester: Secondary | ICD-10-CM | POA: Insufficient documentation

## 2023-04-29 MED ORDER — SODIUM CHLORIDE 0.9 % IV SOLN
300.0000 mg | Freq: Once | INTRAVENOUS | Status: AC
  Administered 2023-04-29: 300 mg via INTRAVENOUS
  Filled 2023-04-29: qty 300

## 2023-05-04 LAB — OB RESULTS CONSOLE GC/CHLAMYDIA
Chlamydia: NEGATIVE
Neisseria Gonorrhea: NEGATIVE

## 2023-05-11 ENCOUNTER — Other Ambulatory Visit: Payer: Self-pay

## 2023-05-11 DIAGNOSIS — Z6835 Body mass index (BMI) 35.0-35.9, adult: Secondary | ICD-10-CM

## 2023-05-11 DIAGNOSIS — O30042 Twin pregnancy, dichorionic/diamniotic, second trimester: Secondary | ICD-10-CM

## 2023-05-11 LAB — OB RESULTS CONSOLE GBS: GBS: POSITIVE

## 2023-05-11 LAB — OB RESULTS CONSOLE RPR: RPR: NONREACTIVE

## 2023-05-16 ENCOUNTER — Other Ambulatory Visit: Payer: Self-pay | Admitting: Obstetrics

## 2023-05-16 ENCOUNTER — Inpatient Hospital Stay
Admission: RE | Admit: 2023-05-16 | Discharge: 2023-05-19 | DRG: 806 | Disposition: A | Discharge status: Home / Self Care | Payer: BC Managed Care – PPO | Attending: Obstetrics | Admitting: Obstetrics

## 2023-05-16 ENCOUNTER — Encounter: Payer: Self-pay | Admitting: Obstetrics and Gynecology

## 2023-05-16 ENCOUNTER — Ambulatory Visit: Payer: BC Managed Care – PPO

## 2023-05-16 ENCOUNTER — Other Ambulatory Visit: Payer: Self-pay

## 2023-05-16 ENCOUNTER — Ambulatory Visit (HOSPITAL_BASED_OUTPATIENT_CLINIC_OR_DEPARTMENT_OTHER): Payer: BC Managed Care – PPO

## 2023-05-16 DIAGNOSIS — Z3A36 36 weeks gestation of pregnancy: Secondary | ICD-10-CM

## 2023-05-16 DIAGNOSIS — O30042 Twin pregnancy, dichorionic/diamniotic, second trimester: Secondary | ICD-10-CM

## 2023-05-16 DIAGNOSIS — O9081 Anemia of the puerperium: Secondary | ICD-10-CM | POA: Diagnosis not present

## 2023-05-16 DIAGNOSIS — O30043 Twin pregnancy, dichorionic/diamniotic, third trimester: Secondary | ICD-10-CM | POA: Insufficient documentation

## 2023-05-16 DIAGNOSIS — E669 Obesity, unspecified: Secondary | ICD-10-CM

## 2023-05-16 DIAGNOSIS — O99824 Streptococcus B carrier state complicating childbirth: Secondary | ICD-10-CM | POA: Diagnosis present

## 2023-05-16 DIAGNOSIS — O99214 Obesity complicating childbirth: Secondary | ICD-10-CM | POA: Diagnosis present

## 2023-05-16 DIAGNOSIS — O99213 Obesity complicating pregnancy, third trimester: Secondary | ICD-10-CM | POA: Insufficient documentation

## 2023-05-16 DIAGNOSIS — D62 Acute posthemorrhagic anemia: Secondary | ICD-10-CM | POA: Diagnosis not present

## 2023-05-16 DIAGNOSIS — Z7982 Long term (current) use of aspirin: Secondary | ICD-10-CM | POA: Diagnosis not present

## 2023-05-16 DIAGNOSIS — O30049 Twin pregnancy, dichorionic/diamniotic, unspecified trimester: Secondary | ICD-10-CM | POA: Diagnosis present

## 2023-05-16 DIAGNOSIS — O4290 Premature rupture of membranes, unspecified as to length of time between rupture and onset of labor, unspecified weeks of gestation: Principal | ICD-10-CM | POA: Diagnosis present

## 2023-05-16 DIAGNOSIS — O26893 Other specified pregnancy related conditions, third trimester: Secondary | ICD-10-CM | POA: Diagnosis present

## 2023-05-16 MED ORDER — ACETAMINOPHEN 325 MG PO TABS: 650.0000 mg | ORAL_TABLET | ORAL | Status: DC | PRN

## 2023-05-16 MED ORDER — ONDANSETRON HCL 4 MG/2ML IJ SOLN: 4.0000 mg | Freq: Four times a day (QID) | INTRAMUSCULAR | Status: DC | PRN

## 2023-05-16 MED ORDER — LACTATED RINGERS IV SOLN: INTRAVENOUS | Status: DC

## 2023-05-16 MED ORDER — LIDOCAINE HCL (PF) 1 % IJ SOLN
30.0000 mL | INTRAMUSCULAR | Status: DC | PRN
  Filled 2023-05-16: qty 30

## 2023-05-16 MED ORDER — SOD CITRATE-CITRIC ACID 500-334 MG/5ML PO SOLN: 30.0000 mL | ORAL | Status: DC | PRN

## 2023-05-16 MED ORDER — OXYCODONE-ACETAMINOPHEN 5-325 MG PO TABS: 2.0000 | ORAL_TABLET | ORAL | Status: DC | PRN

## 2023-05-16 MED ORDER — SODIUM CHLORIDE 0.9 % IV SOLN: 2.0000 g | Freq: Once | INTRAVENOUS | Status: DC

## 2023-05-16 MED ORDER — LACTATED RINGERS IV SOLN: 500.0000 mL | INTRAVENOUS | Status: DC | PRN

## 2023-05-16 MED ORDER — OXYCODONE-ACETAMINOPHEN 5-325 MG PO TABS: 1.0000 | ORAL_TABLET | ORAL | Status: DC | PRN

## 2023-05-16 MED ORDER — OXYTOCIN-SODIUM CHLORIDE 30-0.9 UT/500ML-% IV SOLN
2.5000 [IU]/h | INTRAVENOUS | Status: DC
  Filled 2023-05-16: qty 500

## 2023-05-16 MED ORDER — FENTANYL CITRATE (PF) 100 MCG/2ML IJ SOLN: 50.0000 ug | INTRAMUSCULAR | Status: DC | PRN

## 2023-05-16 MED ORDER — SODIUM CHLORIDE 0.9 % IV SOLN: 1.0000 g | INTRAVENOUS | Status: DC

## 2023-05-16 MED ORDER — OXYTOCIN BOLUS FROM INFUSION: 333.0000 mL | Freq: Once | INTRAVENOUS | Status: DC

## 2023-05-16 NOTE — Procedures (Signed)
Sheila Sharp 29-Jun-1991 [redacted]w[redacted]d   Fetus B Non-Stress Test Interpretation for 05/16/23  Indication:  DiDi Twins  Fetal Heart Rate Fetus B Mode: External Baseline Rate (B): 120 BPM Variability: Moderate Accelerations: 15 x 15 Decelerations: None  Uterine Activity Mode: Toco Contraction Frequency (min): 2 Contraction Quality: Mild  Interpretation (Baby B - Fetal Testing) Nonstress Test Interpretation (Baby B): Reactive  Sheila Sharp [birth year] [redacted]w[redacted]d  Fetus A Non-Stress Test Interpretation for 05/16/23  Indication:  DiDiTwins  Fetal Heart Rate A Mode: External Baseline Rate (A): 140 bpm Variability: Moderate Accelerations: 15 x 15 Decelerations: None Multiple birth?: Yes  Uterine Activity Mode: Toco Contraction Frequency (min): 2 Contraction Quality: Mild  Interpretation (Fetal Testing) Nonstress Test Interpretation: Reactive (Dr. Parke Poisson interpreted NST)

## 2023-05-17 ENCOUNTER — Inpatient Hospital Stay: Payer: BC Managed Care – PPO | Admitting: Anesthesiology

## 2023-05-17 ENCOUNTER — Encounter: Payer: Self-pay | Admitting: Obstetrics and Gynecology

## 2023-05-17 DIAGNOSIS — O30049 Twin pregnancy, dichorionic/diamniotic, unspecified trimester: Secondary | ICD-10-CM | POA: Diagnosis present

## 2023-05-17 LAB — CBC
HCT: 35.3 % — ABNORMAL LOW (ref 36.0–46.0)
HCT: 33.8 % — ABNORMAL LOW (ref 36.0–46.0)
HCT: 32.8 % — ABNORMAL LOW (ref 36.0–46.0)
Hemoglobin: 10.8 g/dL — ABNORMAL LOW (ref 12.0–15.0)
Hemoglobin: 10.4 g/dL — ABNORMAL LOW (ref 12.0–15.0)
Hemoglobin: 9.8 g/dL — ABNORMAL LOW (ref 12.0–15.0)
MCH: 20 pg — ABNORMAL LOW (ref 26.0–34.0)
MCH: 20.1 pg — ABNORMAL LOW (ref 26.0–34.0)
MCH: 19.8 pg — ABNORMAL LOW (ref 26.0–34.0)
MCHC: 30.6 g/dL (ref 30.0–36.0)
MCHC: 30.8 g/dL (ref 30.0–36.0)
MCHC: 29.9 g/dL — ABNORMAL LOW (ref 30.0–36.0)
MCV: 65.4 fL — ABNORMAL LOW (ref 80.0–100.0)
MCV: 65.4 fL — ABNORMAL LOW (ref 80.0–100.0)
MCV: 66.4 fL — ABNORMAL LOW (ref 80.0–100.0)
Platelets: 290 10*3/uL (ref 150–400)
Platelets: 248 10*3/uL (ref 150–400)
Platelets: 233 10*3/uL (ref 150–400)
RBC: 5.4 MIL/uL — ABNORMAL HIGH (ref 3.87–5.11)
RBC: 5.17 MIL/uL — ABNORMAL HIGH (ref 3.87–5.11)
RBC: 4.94 MIL/uL (ref 3.87–5.11)
RDW: 21.3 % — ABNORMAL HIGH (ref 11.5–15.5)
RDW: 21.2 % — ABNORMAL HIGH (ref 11.5–15.5)
RDW: 20.9 % — ABNORMAL HIGH (ref 11.5–15.5)
WBC: 10.1 10*3/uL (ref 4.0–10.5)
WBC: 9.1 10*3/uL (ref 4.0–10.5)
WBC: 11.2 10*3/uL — ABNORMAL HIGH (ref 4.0–10.5)
nRBC: 0 % (ref 0.0–0.2)
nRBC: 0 % (ref 0.0–0.2)
nRBC: 0 % (ref 0.0–0.2)

## 2023-05-17 LAB — TYPE AND SCREEN
ABO/RH(D): B POS
Antibody Screen: NEGATIVE

## 2023-05-17 LAB — RPR
RPR Ser Ql: NONREACTIVE
RPR Ser Ql: NONREACTIVE

## 2023-05-17 MED ORDER — ONDANSETRON HCL 4 MG/2ML IJ SOLN: 4.0000 mg | Freq: Four times a day (QID) | INTRAMUSCULAR | Status: DC | PRN

## 2023-05-17 MED ORDER — ONDANSETRON HCL 4 MG/2ML IJ SOLN: 4.0000 mg | INTRAMUSCULAR | Status: DC | PRN

## 2023-05-17 MED ORDER — ALBUTEROL SULFATE (2.5 MG/3ML) 0.083% IN NEBU: 2.5000 mg | INHALATION_SOLUTION | Freq: Four times a day (QID) | RESPIRATORY_TRACT | Status: DC | PRN

## 2023-05-17 MED ORDER — BENZOCAINE-MENTHOL 20-0.5 % EX AERO: 1.0000 | INHALATION_SPRAY | CUTANEOUS | Status: DC | PRN

## 2023-05-17 MED ORDER — SIMETHICONE 80 MG PO CHEW: 80.0000 mg | CHEWABLE_TABLET | ORAL | Status: DC | PRN

## 2023-05-17 MED ORDER — BISACODYL 10 MG RE SUPP: 10.0000 mg | Freq: Every day | RECTAL | Status: DC | PRN

## 2023-05-17 MED ORDER — OXYCODONE-ACETAMINOPHEN 5-325 MG PO TABS
2.0000 | ORAL_TABLET | ORAL | Status: DC | PRN
  Administered 2023-05-17: 2 via ORAL
  Filled 2023-05-17: qty 2

## 2023-05-17 MED ORDER — ALBUTEROL SULFATE HFA 108 (90 BASE) MCG/ACT IN AERS: 1.0000 | INHALATION_SPRAY | Freq: Four times a day (QID) | RESPIRATORY_TRACT | Status: DC | PRN

## 2023-05-17 MED ORDER — COCONUT OIL OIL
1.0000 | TOPICAL_OIL | Status: DC | PRN
  Filled 2023-05-17: qty 7.5

## 2023-05-17 MED ORDER — OXYCODONE HCL 5 MG PO TABS: 5.0000 mg | ORAL_TABLET | ORAL | Status: DC | PRN

## 2023-05-17 MED ORDER — ONDANSETRON HCL 4 MG PO TABS: 4.0000 mg | ORAL_TABLET | ORAL | Status: DC | PRN

## 2023-05-17 MED ORDER — SENNOSIDES-DOCUSATE SODIUM 8.6-50 MG PO TABS: 2.0000 | ORAL_TABLET | ORAL | Status: DC

## 2023-05-17 MED ORDER — DIPHENHYDRAMINE HCL 25 MG PO CAPS: 25.0000 mg | ORAL_CAPSULE | Freq: Four times a day (QID) | ORAL | Status: DC | PRN

## 2023-05-17 MED ORDER — DIBUCAINE (PERIANAL) 1 % EX OINT: 1.0000 | TOPICAL_OINTMENT | CUTANEOUS | Status: DC | PRN

## 2023-05-17 MED ORDER — AMMONIA AROMATIC IN INHA
RESPIRATORY_TRACT | Status: AC
  Filled 2023-05-17: qty 10

## 2023-05-17 MED ORDER — SENNOSIDES-DOCUSATE SODIUM 8.6-50 MG PO TABS
2.0000 | ORAL_TABLET | Freq: Every day | ORAL | Status: DC
  Administered 2023-05-17 – 2023-05-19 (×3): 2 via ORAL
  Filled 2023-05-17 (×3): qty 2

## 2023-05-17 MED ORDER — LACTATED RINGERS IV SOLN: INTRAVENOUS | Status: DC

## 2023-05-17 MED ORDER — IBUPROFEN 600 MG PO TABS: 600.0000 mg | ORAL_TABLET | Freq: Four times a day (QID) | ORAL | Status: DC

## 2023-05-17 MED ORDER — OXYTOCIN-SODIUM CHLORIDE 30-0.9 UT/500ML-% IV SOLN: 2.5000 [IU]/h | INTRAVENOUS | Status: DC

## 2023-05-17 MED ORDER — ACETAMINOPHEN 325 MG PO TABS: 650.0000 mg | ORAL_TABLET | ORAL | Status: DC | PRN

## 2023-05-17 MED ORDER — MISOPROSTOL 200 MCG PO TABS
ORAL_TABLET | ORAL | Status: AC
  Filled 2023-05-17: qty 4

## 2023-05-17 MED ORDER — OXYTOCIN BOLUS FROM INFUSION
333.0000 mL | Freq: Once | INTRAVENOUS | Status: AC
  Administered 2023-05-17: 333 mL via INTRAVENOUS

## 2023-05-17 MED ORDER — WITCH HAZEL-GLYCERIN EX PADS: 1.0000 | MEDICATED_PAD | CUTANEOUS | Status: DC | PRN

## 2023-05-17 MED ORDER — IBUPROFEN 600 MG PO TABS
600.0000 mg | ORAL_TABLET | Freq: Four times a day (QID) | ORAL | Status: DC
  Administered 2023-05-17 – 2023-05-19 (×10): 600 mg via ORAL
  Filled 2023-05-17 (×10): qty 1

## 2023-05-17 MED ORDER — MEASLES, MUMPS & RUBELLA VAC IJ SOLR
0.5000 mL | Freq: Once | INTRAMUSCULAR | Status: DC
  Filled 2023-05-17: qty 0.5

## 2023-05-17 MED ORDER — LIDOCAINE HCL (PF) 1 % IJ SOLN: 30.0000 mL | INTRAMUSCULAR | Status: DC | PRN

## 2023-05-17 MED ORDER — FLEET ENEMA RE ENEM: 1.0000 | ENEMA | Freq: Every day | RECTAL | Status: DC | PRN

## 2023-05-17 MED ORDER — IBUPROFEN 600 MG PO TABS
ORAL_TABLET | ORAL | Status: AC
  Filled 2023-05-17: qty 1

## 2023-05-17 MED ORDER — SODIUM CHLORIDE 0.9 % IV SOLN: 250.0000 mL | INTRAVENOUS | Status: DC | PRN

## 2023-05-17 MED ORDER — SODIUM CHLORIDE 0.9% FLUSH: 3.0000 mL | INTRAVENOUS | Status: DC | PRN

## 2023-05-17 MED ORDER — SOD CITRATE-CITRIC ACID 500-334 MG/5ML PO SOLN: 30.0000 mL | ORAL | Status: DC | PRN

## 2023-05-17 MED ORDER — OXYTOCIN 10 UNIT/ML IJ SOLN
INTRAMUSCULAR | Status: AC
  Filled 2023-05-17: qty 2

## 2023-05-17 MED ORDER — PANTOPRAZOLE SODIUM 20 MG PO TBEC
20.0000 mg | DELAYED_RELEASE_TABLET | Freq: Every day | ORAL | Status: DC
  Administered 2023-05-17 – 2023-05-19 (×3): 20 mg via ORAL
  Filled 2023-05-17 (×3): qty 1

## 2023-05-17 MED ORDER — SODIUM CHLORIDE 0.9% FLUSH
3.0000 mL | Freq: Two times a day (BID) | INTRAVENOUS | Status: DC
  Administered 2023-05-17 – 2023-05-18 (×2): 3 mL via INTRAVENOUS

## 2023-05-17 MED ORDER — TETANUS-DIPHTH-ACELL PERTUSSIS 5-2.5-18.5 LF-MCG/0.5 IM SUSY: 0.5000 mL | PREFILLED_SYRINGE | Freq: Once | INTRAMUSCULAR | Status: DC

## 2023-05-17 MED ORDER — ZOLPIDEM TARTRATE 5 MG PO TABS: 5.0000 mg | ORAL_TABLET | Freq: Every evening | ORAL | Status: DC | PRN

## 2023-05-17 MED ORDER — LACTATED RINGERS IV SOLN: 500.0000 mL | INTRAVENOUS | Status: DC | PRN

## 2023-05-17 MED ORDER — ACETAMINOPHEN 325 MG PO TABS
650.0000 mg | ORAL_TABLET | ORAL | Status: DC | PRN
  Administered 2023-05-17 (×3): 650 mg via ORAL
  Filled 2023-05-17 (×3): qty 2

## 2023-05-17 MED ORDER — PRENATAL MULTIVITAMIN CH
1.0000 | ORAL_TABLET | Freq: Every day | ORAL | Status: DC
  Administered 2023-05-17 – 2023-05-19 (×3): 1 via ORAL
  Filled 2023-05-17 (×3): qty 1

## 2023-05-17 NOTE — Progress Notes (Signed)
Postpartum Day  0  Subjective: 32 y.o. [id number] postpartum day #0 status post normal spontaneous vaginal delivery. She is ambulating, is tolerating po, is voiding spontaneously.  Her pain is well controlled on PO pain medications. Her lochia is less than menses.  Objective: BP 117/72 (BP Location: Right Arm)   Pulse 74   Temp 98.6 F (37 C) (Oral)   Resp 18   Ht 5\' 11"  (1.803 m)   Wt 129 kg   LMP 06/28/2022 (Exact Date)   SpO2 99%   Breastfeeding Unknown   BMI 39.66 kg/m    Physical Exam:  General: alert, cooperative, and no distress Breasts: soft/nontender Pulm: nl effort Abdomen: soft, non-tender, active bowel sounds Uterine Fundus: firm Perineum: minimal edema, intact Lochia: appropriate DVT Evaluation: No evidence of DVT seen on physical exam.  Recent Labs    05/17/23 0124 05/17/23 0619  HGB 10.4* 9.8*  HCT 33.8* 32.8*  WBC 9.1 11.2*  PLT 248 233    Assessment/Plan: 32 y.o. [id number] postpartum day # 0  1. Continue routine postpartum care  2. Infant feeding status: breast and formula feeding -Lactation consult PRN for breastfeeding   3. Contraception plan: Depo-Provera  4. Maternal iron deficiency anemia - clinically significant.  -Hemodynamically stable and asymptomatic -Intervention: start on oral supplementation with ferrous sulfate 325 mg   5. Immunization status:   all immunizations up to date  Disposition: continue inpatient postpartum care    LOS: 1 day   Gustavo Lah, CNM 05/17/2023, 1:18 PM   ----- Margaretmary Eddy  Certified Nurse Midwife [facility]

## 2023-05-17 NOTE — Lactation Note (Signed)
This note was copied from a baby's chart. Lactation Consultation Note  Patient Name: Sheila Sharp [id number] Date: 05/17/2023 Age:32 hours Reason for consult: Initial assessment;Exclusive pumping and bottle feeding;Late-preterm 34-36.6wks;Other (Comment) (Breastpump initiation)   Maternal Data Twin delivery, mom's 3rd and 4th baby, SVD. Mom with history of anemia and obesity. Mom formula fed her previous children. Per mom she tried pumping with a previous child but did not continue.   On initial visit mom reported she would like to initiate breastpumping. Per mom her plan is to pump and provide some breastmilk and formula feed. Mom does not want to breastfeed.   Has patient been taught Hand Expression?: Yes Does the patient have breastfeeding experience prior to this delivery?: No  Feeding; Mother's Current Feeding Choice: Breast Milk and Formula Nipple Type: Dr. Lorne Skeens   Lactation Tools Discussed/Used Tools: Pump Pump Education: Setup, frequency, and cleaning;Milk Storage;Other (comment) Reason for Pumping: Mother of LPT twins requests to breastpump. Her plan is to do some pumping and formula feed. She does not want to breastfeed. Pumping frequency: to maximize milk production recommended mom goal to pump 8 times/24 hours. Pumped volume: 1 mL  Interventions Interventions: DEBP;Hand pump;Education  Discharge Pump: Personal  Consult Status Consult Status: Follow-up Date: 05/18/23 Follow-up type: In-patient  Update provided to care nurse.  Fuller Song 05/17/2023, 5:37 PM

## 2023-05-17 NOTE — H&P (Signed)
OB History & Physical   History of Present Illness:  Chief Complaint:   HPI:  Sheila Sharp is a 32 y.o. G39P2002 female at [redacted]w[redacted]d dated by u/s.  She presents to L&D for grossly rupture and in active labor with Di/Di twins. Dr. Dalbert Garnet called in and arrived as baby A was delivered.  She reports:  -active fetal movement -LOF/SROM  -no vaginal bleeding -onset of contractions at 2300  Pregnancy Issues: 1. Multi-fetal gestation - Di/Di twins 2.Obesity BMI: 37.39 3. Iron deficient anemia    Maternal Medical History:   Past Medical History:  Diagnosis Date   Medical history non-contributory     History reviewed. No pertinent surgical history.  No Known Allergies  Prior to Admission medications   Medication Sig Start Date End Date Taking? Authorizing Provider  albuterol (PROVENTIL HFA;VENTOLIN HFA) 108 (90 Base) MCG/ACT inhaler Inhale 1-2 puffs into the lungs every 6 (six) hours as needed for wheezing or shortness of breath. 12/12/17   Georgetta Haber, NP  aspirin EC 81 MG tablet Take 81 mg by mouth daily. Swallow whole.    [provider]  ondansetron (ZOFRAN-ODT) 4 MG disintegrating tablet Take 4 mg by mouth every 8 (eight) hours as needed for nausea or vomiting.    [provider]  pantoprazole (PROTONIX) 20 MG tablet Take 20 mg by mouth daily.    [provider]  Prenatal Vit-Fe Fumarate-FA (PRENATAL MULTIVITAMIN) TABS tablet Take 1 tablet by mouth daily at 12 noon.    [provider]     Prenatal care site: [facility]   Social History: She  reports that she has never smoked. She has never used smokeless tobacco. She reports that she does not currently use alcohol after a past usage of about 5.0 standard drinks of alcohol per week. She reports that she does not use drugs.  Family History: family history is not on file.   Review of Systems: A full review of systems was performed and negative except as noted in the HPI.     Physical Exam:  Vital Signs: BP 133/84 (BP Location: Left Arm)   Pulse 82   LMP 06/28/2022 (Exact Date)   General:   alert and cooperative  Skin:  normal  Neurologic:    Alert & oriented x 3  Lungs:    Nl effort  Heart:   regular rate and rhythm  Abdomen:  soft, non-tender; bowel sounds normal; no masses,  no organomegaly  Extremities: : non-tender, symmetric, no edema bilaterally.      Pertinent Results:  Prenatal Labs: Blood type/Rh B pos  Antibody screen neg  Rubella Immune  Varicella Immune  RPR NR  HBsAg Neg  HIV NR  GC neg  Chlamydia neg  Genetic screening negative  1 hour GTT 134  3 hour GTT   GBS pos    SVE: Dilation: 10 / Effacement (%): 100 / Station: 0     [place] MFM OB FOLLOW UP  Result Date: 05/16/2023 ----------------------------------------------------------------------  OBSTETRICS REPORT                       (Signed Final 05/16/2023 05:35 pm) ---------------------------------------------------------------------- Patient Info  ID #:       [id number]                          D.O.B.:  1990-10-18 (32 yrs)  Name:       Sheila Sharp  Visit Date: 05/16/2023 03:02 pm ---------------------------------------------------------------------- Performed By  Attending:        Ma Rings MD         Ref. Address:     [facility]  Performed By:     Eden Lathe BS      [facility]                                 [facility] at                                                             [facility]  Referred By:      Janyce Llanos CNM ---------------------------------------------------------------------- Orders  #  Description                           Code        Ordered By  1  [place] MFM OB FOLLOW UP                   16109.60    YU FANG  2  [place] MFM OB FOLLOW UP ADDL              45409.81    YU FANG     GEST  3  [place] MFM FETAL BPP                      76818.5     YU FANG      W/NONSTRESS  4  [place] MFM FETAL BPP                      19147.8     YU FANG     W/NONSTRESS ADD'L GEST ----------------------------------------------------------------------  #  Order #                     Accession #                Episode #  1  [id number]                   [id number]                 [id number]  2  [id number]                   [id number]                 [id number]  3  [id number]                   [id number]                 [id number]  4  [id number]                   [id number]  [id number] ---------------------------------------------------------------------- Indications  Twin pregnancy, di/di, third trimester         O30.043  Obesity complicating pregnancy, third          O99.213  trimester  [redacted] weeks gestation of pregnancy                Z3A.36 ---------------------------------------------------------------------- Fetal Evaluation (Fetus A)  Num Of Fetuses:         2  Cardiac Activity:       Observed  Fetal Lie:              Maternal left side (head left, body rt)  Presentation:           Cephalic  Placenta:               Anterior  P. Cord Insertion:      Previously seen  Amniotic Fluid  AFI FV:      Within normal limits                              Largest Pocket(cm)                              5.7 ---------------------------------------------------------------------- Biophysical Evaluation (Fetus A)  Amniotic F.V:   Within normal limits       F. Tone:        Not Observed  F. Movement:    Observed                   N.S.T:          Reactive  F. Breathing:   Observed                   Score:          8/10 ---------------------------------------------------------------------- Biometry (Fetus A)  BPD:     89.89  mm     G. Age:  36w 3d         61  %    CI:        69.64   %    70 - 86                                                          FL/HC:      19.5   %    20.1 - 22.1  HC:    343.77   mm     G. Age:  39w 5d         91  %    HC/AC:      1.03        0.93 - 1.11  AC:    334.66   mm     G.  Age:  37w 3d         84  %    FL/BPD:     74.7   %    71 - 87  FL:      67.13  mm     G. Age:  34w 4d          8  %    FL/AC:      20.1   %    20 - 24  LV:        2.3  mm  Est. FW:    3041  gm    6 lb 11 oz      64  %     FW Discordancy      0 \ 7 % ---------------------------------------------------------------------- Gestational Age (Fetus A)  LMP:           46w 0d        Date:  06/28/22                  EDD:   04/04/23  U/S Today:     37w 0d                                        EDD:   06/06/23  Best:          36w 3d     Det. ByMarcella Dubs         EDD:   06/10/23                                      (11/23/22) ---------------------------------------------------------------------- Anatomy (Fetus A)  Cranium:               Appears normal         LVOT:                   Previously seen  Cavum:                 Previously seen        Aortic Arch:            Previously seen  Ventricles:            Appears normal         Ductal Arch:            Previously seen  Choroid Plexus:        Previously seen        Diaphragm:              Previously seen  Cerebellum:            Previously seen        Stomach:                Appears normal, left                                                                        sided  Posterior Fossa:       Previously seen        Abdomen:                Previously seen  Nuchal Fold:           Not applicable (>20    Abdominal Wall:         Previously seen                         wks GA)  Face:  Appears normal         Cord Vessels:           Previously seen                         (orbits and profile)  Lips:                  Appears normal         Kidneys:                Appear normal  Palate:                Previously seen        Bladder:                Appears normal  Thoracic:              Previously seen        Spine:                  Previously seen  Heart:                 Previously seen        Upper Extremities:      Previously seen  RVOT:                  Previously  seen        Lower Extremities:      Previously seen  Other:  Open hand/5th prev visualized ---------------------------------------------------------------------- Fetal Evaluation (Fetus B)  Num Of Fetuses:         2  Cardiac Activity:       Observed  Fetal Lie:              Upper Fetus  Presentation:           Cephalic  Placenta:               Anterior  P. Cord Insertion:      Previously seen  Amniotic Fluid  AFI FV:      Within normal limits                              Largest Pocket(cm)                              4.38 ---------------------------------------------------------------------- Biophysical Evaluation (Fetus B)  Amniotic F.V:   Within normal limits       F. Tone:        Not Observed  F. Movement:    Observed                   N.S.T:          Reactive  F. Breathing:   Observed                   Score:          8/10 ---------------------------------------------------------------------- Biometry (Fetus B)  BPD:     88.33  mm     G. Age:  35w 5d         40  %    CI:        76.25   %    70 - 86  FL/HC:      21.3   %    20.1 - 22.1  HC:    320.54   mm     G. Age:  36w 1d         16  %    HC/AC:      0.98        0.93 - 1.11  AC:    325.49   mm     G. Age:  36w 3d         63  %    FL/BPD:     77.2   %    71 - 87  FL:      68.21  mm     G. Age:  35w 0d         15  %    FL/AC:      21.0   %    20 - 24  Est. FW:    2819  gm      6 lb 3 oz     41  %     FW Discordancy         7  % ---------------------------------------------------------------------- Gestational Age (Fetus B)  LMP:           46w 0d        Date:  06/28/22                  EDD:   04/04/23  U/S Today:     35w 6d                                        EDD:   06/14/23  Best:          36w 3d     Det. ByMarcella Dubs         EDD:   06/10/23                                      (11/23/22) ---------------------------------------------------------------------- Anatomy (Fetus B)  Cranium:                Previously seen        LVOT:                   Previously seen  Cavum:                 Previously seen        Aortic Arch:            Previously seen  Ventricles:            Previously seen        Ductal Arch:            Previously seen  Choroid Plexus:        Previously seen        Diaphragm:              Previously seen  Cerebellum:            Previously seen        Stomach:                Appears normal, left  sided  Posterior Fossa:       Previously seen        Abdomen:                Previously seen  Nuchal Fold:           Not applicable (>20    Abdominal Wall:         Previously seen                         wks GA)  Face:                  Orbits and profile     Cord Vessels:           Previously seen                         previously seen  Lips:                  Previously seen        Kidneys:                Appear normal  Palate:                Previously seen        Bladder:                Appears normal  Thoracic:              Previously seen        Spine:                  Previously seen  Heart:                 Previously seen        Upper Extremities:      Previously seen  RVOT:                  Previously seen        Lower Extremities:      Previously seen ---------------------------------------------------------------------- Cervix Uterus Adnexa  Cervix  Not visualized (advanced GA >24wks)  Uterus  No abnormality visualized.  Right Ovary  Not visualized.  Left Ovary  Not visualized.  Cul De Sac  No free fluid seen.  Adnexa  No abnormality visualized ---------------------------------------------------------------------- Comments  This patient was seen for a follow up growth scan and BPP  due to a dichorionic, diamniotic twin gestation and maternal  obesity.  She denies any problems since her last exam.  She was informed that the fetal growth and amniotic fluid  level appears appropriate for her gestational age for both twin  A and  twin B.  A BPP performed today was 8 out of 10 with a reactive NST  for both twin A and twin B.  Twin A and twin B received a -2  today due to absent fetal tone.  Due to the BPP of 8 out of 10 for twin A and twin B, the  patient should have another NST performed in your office  during her prenatal visit in 2 days.  Contractions every 2 to 3 minutes were noted on today's  NST.  Labor precautions and fetal kick count instructions were  reviewed today.  She is already scheduled for delivery on May 27, 2023.  She will return in 1 week for another NST (should she remain  undelivered). ----------------------------------------------------------------------  Ma Rings, MD Electronically Signed Final Report   05/16/2023 05:35 pm ----------------------------------------------------------------------     Assessment:  JANAYSIA STERTZ is a 32 y.o. G31P2002 female at [redacted]w[redacted]d with LOF and twin gestation.   Plan:  1. Admit to Labor & Delivery; consents reviewed and obtained  2. Fetal Well being  - Fetal Tracing: reassuring - GBS pos - Presentation of baby A: vtx confirmed by sve   3. Routine OB: - Prenatal labs reviewed, as above - Rh pos - CBC & T&S on admit - Clear fluids, IVF  4. Monitoring of Labor -  Contractions byexternal toco in place -  Pelvis proven to 3170g -  Plan for continuous fetal monitoring  -  Maternal pain control as desired: IVPM, nitrous, regional anesthesia - Anticipate vaginal delivery  5. Post Partum Planning: - Infant feeding: formula and expressed milk - Contraception: Depo - Tdap: not given  Garrin Kirwan, CNM 05/17/2023 12:55 AM

## 2023-05-17 NOTE — Progress Notes (Signed)
Out of OR back to Rm 6

## 2023-05-17 NOTE — Discharge Summary (Signed)
Obstetrical Discharge Summary  Patient Name: Sheila Sharp DOB: 1991/05/10 MRN: [id number]  Date of Admission: 05/16/2023 Date of Delivery: 05/17/23 Delivered by: Haroldine Laws, CNM  Date of Discharge: 05/19/2023  Primary OB: [facility] [id number]'G last menstrual period was 06/28/2022 (exact date). EDC Estimated Date of Delivery: 06/10/23 Gestational Age at Delivery: [redacted]w[redacted]d   Antepartum complications:  1. Multi-fetal gestation - Di/Di twins 2.Obesity BMI: 37.39 3. Iron deficient anemia   Admitting Diagnosis: Amniotic fluid leaking [O42.90]  Secondary Diagnosis: Patient Active Problem List   Diagnosis Date Noted   NSVD (normal spontaneous vaginal delivery) 05/17/2023   Twin pregnancy, twins dichorionic and diamniotic 05/17/2023   Amniotic fluid leaking 05/16/2023   Decreased fetal movement 04/20/2023   Vaginal pain 03/03/2023   Obesity BMI=35 06/17/2022    Discharge Diagnosis: Term Pregnancy Delivered and Di/Di twins       Augmentation: N/A Complications: None Intrapartum complications/course: see delivery note Delivery Type: spontaneous vaginal delivery Anesthesia: non-pharmacological methods Placenta: spontaneous To Pathology: Yes  Laceration: none Episiotomy: none Newborn Data:   Sheila Sharp, [place] Girl Sheila Sharp [[id number]]  Live born female  Birth Weight:   APGAR: ,   Newborn Delivery   Birth date/time: 05/17/2023 00:20:50 Delivery type: Vaginal, Spontaneous       Sheila Sharp, Sheila Sharp [Sheila Sharp]  Live born female  Birth Weight:   APGAR: ,   Newborn Delivery   Birth date/time: 05/17/2023 00:33:00 Delivery type: Vaginal, Spontaneous     Postpartum Procedures: none Edinburgh:     05/18/2023    5:30 AM 09/09/2019    9:00 AM  Edinburgh Postnatal Depression Scale Screening Tool  I have been able to laugh and see the funny side of things. 0 0  I have looked forward with enjoyment to things. 0 0  I have blamed myself unnecessarily when things  went wrong. 2 0  I have been anxious or worried for no good reason. 0 0  I have felt scared or panicky for no good reason. 0 0  Things have been getting on top of me. 1 0  I have been so unhappy that I have had difficulty sleeping. 1 0  I have felt sad or miserable. 1 0  I have been so unhappy that I have been crying. 1 0  The thought of harming myself has occurred to me. 0 0  Edinburgh Postnatal Depression Scale Total 6 0     Post partum course:  Patient had an uncomplicated postpartum course.  By time of discharge on PPD# 2, her pain was controlled on oral pain medications; she had appropriate lochia and was ambulating, voiding without difficulty and tolerating regular diet.  She was deemed stable for discharge to home.    Discharge Physical Exam:  BP 115/72 (BP Location: Right Arm)   Pulse 78   Temp 98.8 F (37.1 C)   Resp 20   Ht 5\' 11"  (1.803 m)   Wt 129 kg   LMP 06/28/2022 (Exact Date)   SpO2 98%   Breastfeeding Unknown   BMI 39.66 kg/m   General: NAD CV: RRR Pulm: CTABL, nl effort ABD: s/nd/nt, fundus firm and below the umbilicus Lochia: moderate Perineum:minimal edema/intact DVT Evaluation: LE non-ttp, no evidence of DVT on exam.  Hemoglobin  Date Value Ref Range Status  05/18/2023 9.3 (L) 12.0 - 15.0 g/dL Final   HCT  Date Value Ref Range Status  05/18/2023 29.3 (L) 36.0 - 46.0 % Final    Risk assessment for postpartum VTE and prophylactic  treatment: Very high risk factors: None High risk factors: None Moderate risk factors: Multiple gestation  and BMI 30-40 kg/m2  Postpartum VTE prophylaxis with LMWH not indicated  Disposition: stable, discharge to home. Baby Feeding: formula feeding and expressed breast milk Baby Disposition: home with mom  Rh Immune globulin indicated: No Rubella vaccine given: was not indicated Varivax vaccine given: was not indicated Flu vaccine given in AP setting: n/a Tdap vaccine given in AP setting: No  Contraception:  Depo-Provera  Prenatal Labs:  Blood type/Rh B pos  Antibody screen neg  Rubella Immune  Varicella Immune  RPR NR  HBsAg Neg  HIV NR  GC neg  Chlamydia neg  Genetic screening negative  1 hour GTT 134  3 hour GTT    GBS pos    Plan:  Sheila Sharp was discharged to home in good condition.  Discharge Medications: Allergies as of 05/19/2023   No Known Allergies      Medication List     STOP taking these medications    aspirin EC 81 MG tablet   ondansetron 4 MG disintegrating tablet Commonly known as: ZOFRAN-ODT   pantoprazole 20 MG tablet Commonly known as: PROTONIX   prenatal multivitamin Tabs tablet       TAKE these medications    acetaminophen 325 MG tablet Commonly known as: Tylenol Take 2 tablets (650 mg total) by mouth every 4 (four) hours as needed (for pain scale < 4).   albuterol 108 (90 Base) MCG/ACT inhaler Commonly known as: VENTOLIN HFA Inhale 1-2 puffs into the lungs every 6 (six) hours as needed for wheezing or shortness of breath.   benzocaine-Menthol 20-0.5 % Aero Commonly known as: DERMOPLAST Apply 1 Application topically as needed for irritation (perineal discomfort).   coconut oil Oil Apply 1 Application topically as needed.   dibucaine 1 % Oint Commonly known as: NUPERCAINAL Place 1 Application rectally as needed for hemorrhoids (if tucks not working).   ibuprofen 600 MG tablet Commonly known as: ADVIL Take 1 tablet (600 mg total) by mouth every 6 (six) hours.   senna-docusate 8.6-50 MG tablet Commonly known as: Senokot-S Take 2 tablets by mouth daily. Start taking on: May 20, 2023   simethicone 80 MG chewable tablet Commonly known as: MYLICON Chew 1 tablet (80 mg total) by mouth as needed for flatulence.   witch hazel-glycerin pad Commonly known as: TUCKS Apply 1 Application topically as needed for hemorrhoids (for pain).         Follow-up Information     Haroldine Laws, CNM. Schedule an appointment as  soon as possible for a visit in 6 week(s).   Specialty: Certified Nurse Midwife Contact information: [address] [place] [address] [phone]                 Signed: Chari Manning CNM

## 2023-05-18 LAB — CBC
HCT: 29.3 % — ABNORMAL LOW (ref 36.0–46.0)
Hemoglobin: 9.3 g/dL — ABNORMAL LOW (ref 12.0–15.0)
MCH: 20.4 pg — ABNORMAL LOW (ref 26.0–34.0)
MCHC: 31.7 g/dL (ref 30.0–36.0)
MCV: 64.1 fL — ABNORMAL LOW (ref 80.0–100.0)
Platelets: 241 10*3/uL (ref 150–400)
RBC: 4.57 MIL/uL (ref 3.87–5.11)
RDW: 20.6 % — ABNORMAL HIGH (ref 11.5–15.5)
WBC: 7.1 10*3/uL (ref 4.0–10.5)
nRBC: 0 % (ref 0.0–0.2)

## 2023-05-18 MED ORDER — MEDROXYPROGESTERONE ACETATE 150 MG/ML IM SUSP
150.0000 mg | INTRAMUSCULAR | Status: DC
  Administered 2023-05-19: 150 mg via INTRAMUSCULAR
  Filled 2023-05-18: qty 1

## 2023-05-18 NOTE — Progress Notes (Signed)
Postpartum Day  1  Subjective: 32 y.o. [id number] postpartum day #1 status post normal spontaneous vaginal delivery. She is ambulating, is tolerating po, is voiding spontaneously.  Her pain is well controlled on PO pain medications. Her lochia is less than menses.  Objective: BP 123/66 (BP Location: Right Arm)   Pulse 60   Temp 98.5 F (36.9 C) (Oral)   Resp 20   Ht 5\' 11"  (1.803 m)   Wt 129 kg   LMP 06/28/2022 (Exact Date)   SpO2 100%   Breastfeeding Unknown   BMI 39.66 kg/m    Physical Exam:  General: alert, cooperative, and appears stated age Breasts: soft/nontender Pulm: nl effort Abdomen: soft, non-tender, active bowel sounds Uterine Fundus: firm Perineum: minimal edema, intact Lochia: appropriate DVT Evaluation: No evidence of DVT seen on physical exam. Negative Homan's sign. No cords or calf tenderness. No significant calf/ankle edema.  Recent Labs    05/17/23 0619 05/18/23 0623  HGB 9.8* 9.3*  HCT 32.8* 29.3*  WBC 11.2* 7.1  PLT 233 241    Assessment/Plan: 32 y.o. [id number] postpartum day # 1  1. Continue routine postpartum care  2. Infant feeding status: formula feeding --Encouraged snug fitting bra, cold application, Tylenol PRN, and cabbage leaves for engorgement for formula feeding   3. Contraception plan: Depo-Provera  4. Acute blood loss anemia - clinically significant.  --Hemodynamically stable and asymptomatic --Intervention: start on oral supplementation with ferrous sulfate 325 mg   5. Immunization status:   all immunizations up to date   Disposition: continue inpatient postpartum care , plan for discharge home tomorrow    LOS: 2 days   Lean Jaeger Wonda Amis, CNM 05/18/2023, 1:16 PM   ----- Chari Manning Certified Nurse Midwife [facility]

## 2023-05-19 MED ORDER — IBUPROFEN 600 MG PO TABS: 600.0000 mg | ORAL_TABLET | Freq: Four times a day (QID) | ORAL | 0 refills | Status: AC

## 2023-05-19 MED ORDER — COCONUT OIL OIL: 1.0000 | TOPICAL_OIL | Status: AC | PRN

## 2023-05-19 MED ORDER — BENZOCAINE-MENTHOL 20-0.5 % EX AERO: 1.0000 | INHALATION_SPRAY | CUTANEOUS | Status: AC | PRN

## 2023-05-19 MED ORDER — WITCH HAZEL-GLYCERIN EX PADS: 1.0000 | MEDICATED_PAD | CUTANEOUS | Status: AC | PRN

## 2023-05-19 MED ORDER — ACETAMINOPHEN 325 MG PO TABS: 650.0000 mg | ORAL_TABLET | ORAL | Status: AC | PRN

## 2023-05-19 MED ORDER — SIMETHICONE 80 MG PO CHEW: 80.0000 mg | CHEWABLE_TABLET | ORAL | Status: AC | PRN

## 2023-05-19 MED ORDER — DIBUCAINE (PERIANAL) 1 % EX OINT: 1.0000 | TOPICAL_OINTMENT | CUTANEOUS | Status: AC | PRN

## 2023-05-19 MED ORDER — SENNOSIDES-DOCUSATE SODIUM 8.6-50 MG PO TABS: 2.0000 | ORAL_TABLET | Freq: Every day | ORAL | Status: AC

## 2023-05-19 NOTE — Lactation Note (Signed)
This note was copied from a baby's chart. Lactation Consultation Note  Patient Name: Sheila Sharp [id number] Date: 05/19/2023 Age:32 hours Reason for consult: Follow-up assessment;Late-preterm 34-36.6wks;Other (Comment) (Mom initiated breastpumping on tuesday and has not pumped since initiation.)   Maternal Data Twin delivery, mom's 3rd and 4th baby, SVD. Mom with history of anemia and obesity. Mom formula fed her previous children. Per mom she tried pumping with a previous child but did not continue.   On follow-up today mom reports she has not been pumping. She does plan on doing some pumping when she goes home as per mom she will have additional help with feeding the twins. Mom's focus is on feeding her babies right now. Mom reports she is considering buying the hands free pumping system in addition to a double electric pump she has at home.  Has patient been taught Hand Expression?: Yes Does the patient have breastfeeding experience prior to this delivery?: No  Feeding Mother's Current Feeding Choice: Formula (Mom reports she will try pumping when she goes home when she has additional family help with feeding twins.Her hope is to be able to provide some breastmilk.) Nipple Type: Dr. Levert Feinstein Preemie   Interventions Interventions: DEBP. Discussed difference of a DEBP vs. Hands free pump to establish a milk supply. Recommended mom use the DEBP for the first 14 days in her efforts to establish a milk supply. Discussed hands free pump is a maintenance pump which could be useful once her supply is established.  Discharge Discharge Education: Engorgement and breast care;Warning signs for feeding baby;Outpatient recommendation Pump: Personal   Update provided to care nurse.  Consult Status Consult Status: Complete Date: 05/19/23 Follow-up type: In-patient    Fuller Song 05/19/2023, 3:23 PM

## 2023-05-19 NOTE — Discharge Instructions (Signed)

## 2023-05-23 ENCOUNTER — Other Ambulatory Visit: Payer: BC Managed Care – PPO

## 2023-05-24 ENCOUNTER — Telehealth: Payer: Self-pay

## 2023-05-24 NOTE — Telephone Encounter (Signed)
[facility]- Discharge Call Backs-Spoke to patient on the phone about the following below. 1-Do you have any questions or concerns about yourself as you heal?No 2-Any concerns or questions about your babies?No Is your baby eating, peeing,pooping well?Yes 3 Reviewed ABC's of safe sleep. 4-How was your stay at the hospital?Great 5- Did our team work together to care for you?Yes You should be receiving a survey in the mail soon.   We would really appreciate it if you could fill that out for [place] and return it in the mail.  We value the feedback to make improvements and continue the great work we do.   If you have any questions please feel free to call me back at [phone]
